# Patient Record
Sex: Female | Born: 1970 | Race: White | Hispanic: No | Marital: Married | Smoking: Former smoker
Health system: Southern US, Community
[De-identification: ages and names within clinical notes are randomized; demographics above are authoritative.]

## PROBLEM LIST (undated history)

## (undated) HISTORY — PX: CHOLECYSTECTOMY: SHX55

---

## 2004-08-13 ENCOUNTER — Ambulatory Visit: Payer: Self-pay | Admitting: Obstetrics and Gynecology

## 2004-09-26 ENCOUNTER — Observation Stay: Payer: Self-pay

## 2004-10-18 ENCOUNTER — Inpatient Hospital Stay: Payer: Self-pay

## 2007-01-14 ENCOUNTER — Ambulatory Visit: Payer: Self-pay

## 2009-11-23 ENCOUNTER — Ambulatory Visit: Payer: Self-pay | Admitting: General Practice

## 2009-12-01 ENCOUNTER — Inpatient Hospital Stay: Payer: Self-pay | Admitting: Internal Medicine

## 2009-12-03 ENCOUNTER — Other Ambulatory Visit: Payer: Self-pay | Admitting: Internal Medicine

## 2009-12-03 ENCOUNTER — Ambulatory Visit: Payer: Self-pay | Admitting: Internal Medicine

## 2010-10-15 ENCOUNTER — Ambulatory Visit: Payer: Self-pay | Admitting: General Practice

## 2011-10-21 ENCOUNTER — Ambulatory Visit: Payer: Self-pay | Admitting: General Practice

## 2012-06-22 ENCOUNTER — Ambulatory Visit: Payer: Self-pay | Admitting: Pain Medicine

## 2012-06-28 ENCOUNTER — Ambulatory Visit: Payer: Self-pay | Admitting: Pain Medicine

## 2012-07-29 ENCOUNTER — Ambulatory Visit: Payer: Self-pay | Admitting: Pain Medicine

## 2012-08-03 ENCOUNTER — Ambulatory Visit: Payer: Self-pay | Admitting: Physician Assistant

## 2012-08-18 ENCOUNTER — Ambulatory Visit: Payer: Self-pay | Admitting: Pain Medicine

## 2013-02-02 ENCOUNTER — Ambulatory Visit: Payer: Self-pay | Admitting: Orthopedic Surgery

## 2013-06-07 ENCOUNTER — Ambulatory Visit: Payer: Self-pay | Admitting: Pain Medicine

## 2013-06-20 ENCOUNTER — Ambulatory Visit: Payer: Self-pay | Admitting: Pain Medicine

## 2013-07-19 ENCOUNTER — Ambulatory Visit: Payer: Self-pay | Admitting: Pain Medicine

## 2013-08-15 ENCOUNTER — Ambulatory Visit: Payer: Self-pay | Admitting: Pain Medicine

## 2013-10-27 ENCOUNTER — Ambulatory Visit: Payer: Self-pay | Admitting: Nurse Practitioner

## 2016-05-20 ENCOUNTER — Ambulatory Visit: Payer: Self-pay | Admitting: Physician Assistant

## 2016-05-20 VITALS — BP 148/89 | HR 106 | Temp 98.0°F

## 2016-05-20 DIAGNOSIS — J069 Acute upper respiratory infection, unspecified: Secondary | ICD-10-CM

## 2016-05-20 LAB — POCT INFLUENZA A/B
INFLUENZA A, POC: NEGATIVE
INFLUENZA B, POC: NEGATIVE

## 2016-05-20 MED ORDER — PREDNISONE 10 MG PO TABS: 30.0000 mg | ORAL_TABLET | Freq: Every day | ORAL | 0 refills | Status: DC

## 2016-05-20 MED ORDER — AZITHROMYCIN 250 MG PO TABS: ORAL_TABLET | ORAL | 0 refills | Status: DC

## 2016-05-20 NOTE — Progress Notes (Signed)
S: C/o runny nose and congestion with dry cough for 4 days, denies  fever, chills,+ body aches;  denies cp/sob, v/d; mucus was green this am but clear throughout the day, cough is sporadic,   Using otc meds: mucinex  O: PE: vitals wnl, nad,  perrl eomi, normocephalic, tms dull, nasal mucosa red and swollen, throat injected, neck supple no lymph, lungs c t a, cv rrr, neuro intact, flu swab neg  A:  Acute uri   P: drink fluids, continue regular meds , use otc meds of choice, return if not improving in 5 days, return earlier if worsening , zpack, prednisone 30 mg qd x 3d

## 2016-07-17 ENCOUNTER — Encounter: Payer: Self-pay | Admitting: Physician Assistant

## 2016-07-17 ENCOUNTER — Ambulatory Visit: Payer: Self-pay | Admitting: Physician Assistant

## 2016-07-17 VITALS — BP 124/68 | HR 110 | Temp 97.8°F | Ht 62.0 in | Wt 142.0 lb

## 2016-07-17 DIAGNOSIS — Z0000 Encounter for general adult medical examination without abnormal findings: Secondary | ICD-10-CM

## 2016-07-17 MED ORDER — GABAPENTIN 300 MG PO CAPS: 300.0000 mg | ORAL_CAPSULE | Freq: Every day | ORAL | 3 refills | Status: DC

## 2016-07-17 NOTE — Progress Notes (Signed)
S: pt here for wellness physical had biometrics for insurance purposes done at work, states she is having hot flashes, taking zoloft for mood swings, perimenopausal, no complaints ros neg. PMH:  migraines  Social: former smoker, no etoh or drugs  Fam: both parents have htn  O: vitals wnl, nad, ENT wnl, neck supple no lymph, lungs c t a, cv rrr, abd soft nontender bs normal all 4 quads  A: wellness physical, perimenopausal, hot flashes  P: neurontin, mm order

## 2016-10-03 ENCOUNTER — Encounter: Payer: Self-pay | Admitting: Radiology

## 2016-10-03 ENCOUNTER — Ambulatory Visit
Admission: RE | Admit: 2016-10-03 | Discharge: 2016-10-03 | Disposition: A | Discharge status: Home / Self Care | Payer: Managed Care, Other (non HMO) | Source: Ambulatory Visit | Attending: Physician Assistant | Admitting: Physician Assistant

## 2016-10-03 DIAGNOSIS — Z1231 Encounter for screening mammogram for malignant neoplasm of breast: Secondary | ICD-10-CM | POA: Insufficient documentation

## 2016-10-03 DIAGNOSIS — Z0000 Encounter for general adult medical examination without abnormal findings: Secondary | ICD-10-CM

## 2016-12-18 DIAGNOSIS — K580 Irritable bowel syndrome with diarrhea: Secondary | ICD-10-CM | POA: Insufficient documentation

## 2016-12-18 DIAGNOSIS — R1084 Generalized abdominal pain: Secondary | ICD-10-CM | POA: Insufficient documentation

## 2016-12-18 HISTORY — DX: Irritable bowel syndrome with diarrhea: K58.0

## 2016-12-24 ENCOUNTER — Other Ambulatory Visit: Payer: Self-pay | Admitting: Gastroenterology

## 2016-12-24 DIAGNOSIS — R1013 Epigastric pain: Secondary | ICD-10-CM

## 2017-01-05 ENCOUNTER — Ambulatory Visit
Admission: RE | Admit: 2017-01-05 | Discharge: 2017-01-05 | Disposition: A | Discharge status: Home / Self Care | Payer: Managed Care, Other (non HMO) | Source: Ambulatory Visit | Attending: Gastroenterology | Admitting: Gastroenterology

## 2017-03-23 ENCOUNTER — Ambulatory Visit
Admission: RE | Admit: 2017-03-23 | Discharge: 2017-03-23 | Disposition: A | Discharge status: Home / Self Care | Payer: Managed Care, Other (non HMO) | Source: Ambulatory Visit | Attending: Gastroenterology | Admitting: Gastroenterology

## 2017-03-23 DIAGNOSIS — R1013 Epigastric pain: Secondary | ICD-10-CM | POA: Diagnosis present

## 2017-03-23 DIAGNOSIS — Z9049 Acquired absence of other specified parts of digestive tract: Secondary | ICD-10-CM | POA: Diagnosis not present

## 2018-01-01 ENCOUNTER — Other Ambulatory Visit: Payer: Self-pay | Admitting: Gastroenterology

## 2018-01-01 DIAGNOSIS — R140 Abdominal distension (gaseous): Secondary | ICD-10-CM

## 2018-01-07 ENCOUNTER — Encounter: Payer: Self-pay | Admitting: Radiology

## 2018-01-07 ENCOUNTER — Ambulatory Visit
Admission: RE | Admit: 2018-01-07 | Discharge: 2018-01-07 | Disposition: A | Discharge status: Home / Self Care | Payer: Managed Care, Other (non HMO) | Source: Ambulatory Visit | Attending: Gastroenterology | Admitting: Gastroenterology

## 2018-01-07 DIAGNOSIS — R140 Abdominal distension (gaseous): Secondary | ICD-10-CM | POA: Diagnosis present

## 2018-01-07 DIAGNOSIS — D259 Leiomyoma of uterus, unspecified: Secondary | ICD-10-CM | POA: Diagnosis not present

## 2018-01-07 MED ORDER — IOPAMIDOL (ISOVUE-300) INJECTION 61%
85.0000 mL | Freq: Once | INTRAVENOUS | Status: AC | PRN
  Administered 2018-01-07: 85 mL via INTRAVENOUS

## 2018-08-23 ENCOUNTER — Encounter: Payer: Self-pay | Admitting: *Deleted

## 2019-05-06 ENCOUNTER — Encounter: Payer: Self-pay | Admitting: Internal Medicine

## 2019-05-20 ENCOUNTER — Other Ambulatory Visit: Payer: Self-pay | Admitting: Nurse Practitioner

## 2019-05-20 DIAGNOSIS — Z1231 Encounter for screening mammogram for malignant neoplasm of breast: Secondary | ICD-10-CM

## 2019-06-02 ENCOUNTER — Ambulatory Visit
Admission: RE | Admit: 2019-06-02 | Discharge: 2019-06-02 | Disposition: A | Discharge status: Home / Self Care | Payer: Managed Care, Other (non HMO) | Source: Ambulatory Visit | Attending: Nurse Practitioner | Admitting: Nurse Practitioner

## 2019-06-02 DIAGNOSIS — Z1231 Encounter for screening mammogram for malignant neoplasm of breast: Secondary | ICD-10-CM | POA: Diagnosis present

## 2019-06-27 ENCOUNTER — Other Ambulatory Visit: Payer: Self-pay

## 2019-06-28 ENCOUNTER — Ambulatory Visit (INDEPENDENT_AMBULATORY_CARE_PROVIDER_SITE_OTHER): Payer: Managed Care, Other (non HMO) | Admitting: Gastroenterology

## 2019-06-28 ENCOUNTER — Other Ambulatory Visit: Payer: Self-pay

## 2019-06-28 ENCOUNTER — Encounter: Payer: Self-pay | Admitting: Gastroenterology

## 2019-06-28 VITALS — BP 134/87 | HR 98 | Temp 98.1°F | Ht 63.0 in | Wt 145.2 lb

## 2019-06-28 DIAGNOSIS — R140 Abdominal distension (gaseous): Secondary | ICD-10-CM

## 2019-06-28 DIAGNOSIS — K580 Irritable bowel syndrome with diarrhea: Secondary | ICD-10-CM

## 2019-06-28 DIAGNOSIS — R1013 Epigastric pain: Secondary | ICD-10-CM | POA: Diagnosis not present

## 2019-06-28 DIAGNOSIS — K30 Functional dyspepsia: Secondary | ICD-10-CM | POA: Diagnosis not present

## 2019-06-28 HISTORY — DX: Functional dyspepsia: K30

## 2019-06-28 MED ORDER — AMITRIPTYLINE HCL 25 MG PO TABS: 25.0000 mg | ORAL_TABLET | Freq: Every day | ORAL | 0 refills | Status: DC

## 2019-06-28 MED ORDER — RIFAXIMIN 550 MG PO TABS: 550.0000 mg | ORAL_TABLET | Freq: Two times a day (BID) | ORAL | 0 refills | Status: AC

## 2019-06-28 NOTE — Progress Notes (Signed)
Cephas Darby, MD [address]  [place]  [place], [place] [address]  Main: [phone]  Fax: [phone]    Gastroenterology Consultation  Referring Provider:     Danelle Berry, NP Primary Care Physician:  Danelle Berry, NP Primary Gastroenterologist: [facility] Reason for Consultation:   Upper abdominal pain, IBS diarrhea        HPI:   Anna Barnett is a 49 y.o. female referred by Dr. Danelle Berry, NP  for consultation & management of upper abdominal pain, IBS diarrhea.  Patient reports that she has long history GI symptoms.  She had history of cholecystectomy in 1994 secondary to severe epigastric pain which has subsided for 2-3 years since removal of gallbladder.  After this, she had recurrence of severe epigastric, squeezing type of pain associated with nausea and bilious emesis.  She has been taking dicyclomine for several years, 20 mg which provides relief, sometimes she had to take several pills in order to alleviate the pain.  She also had ER visits due to these attacks.  In addition to these intermittent attacks of epigastric pain, she has been also experiencing abdominal bloating, associated with postprandial urgency, diarrhea.  She definitely notices stress triggers these symptoms.  She had history of diarrhea, later resulted in constipation.  Over the last few years, her diarrhea has returned.  Patient also acknowledges that she was drinking several cans of soda daily which she has significantly cut down to about twice a week only about 2 cans/week.  Patient underwent several upper endoscopies, colonoscopy including biopsies which were all unremarkable.  Unclear if she was ever tested for celiac disease.  Her labs including CBC, CMP, hemoglobin A1c, TSH were normal in 2019. She was last seen by [facility] in 04/2018.  She decided to move to [place] for second opinion as well as to establish care  Patient is  currently on Protonix, she also tried several different medications including Linzess, hyoscyamine in the past  NSAIDs: None  Antiplts/Anticoagulants/Anti thrombotics: None  GI Procedures: Upper endoscopy in 01/1993 Colonoscopy in 07/08/2002  Upper endoscopy and colonoscopy 03/09/2017, unremarkable Patient denies family history of GI malignancy, celiac disease, inflammatory bowel disease  History reviewed. No pertinent past medical history.  History reviewed. No pertinent surgical history.   Current Outpatient Medications:  .  baclofen (LIORESAL) 10 MG tablet, Take 10 mg by mouth at bedtime as needed., Disp: , Rfl:  .  butalbital-acetaminophen-caffeine (FIORICET, ESGIC) 50-325-40 MG tablet, 2 TABS EVERY MORNING THEN 2 TABS EVERY EVENING AS NEEDED FOR HEADACHES NO MORE THAN 4 A DAY, Disp: , Rfl: 3 .  dicyclomine (BENTYL) 20 MG tablet, Take by mouth., Disp: , Rfl:  .  hydrOXYzine (ATARAX/VISTARIL) 10 MG tablet, Take 10 mg by mouth 2 (two) times daily as needed., Disp: , Rfl:  .  ketorolac (TORADOL) 10 MG tablet, Take 10 mg by mouth every morning., Disp: , Rfl:  .  ondansetron (ZOFRAN) 4 MG tablet, TAKE 1 TABLET BY MOUTH EVERY 8 HOURS AS NEEDED FOR NAUSEA, Disp: , Rfl:  .  pantoprazole (PROTONIX) 40 MG tablet, Take by mouth., Disp: , Rfl:  .  rosuvastatin (CRESTOR) 5 MG tablet, Take 5 mg by mouth at bedtime., Disp: , Rfl:  .  amitriptyline (ELAVIL) 25 MG tablet, Take 1 tablet (25 mg total) by mouth at bedtime., Disp: 30 tablet, Rfl: 0 .  rifaximin (XIFAXAN) 550 MG TABS tablet, Take 1 tablet (550 mg total) by  mouth 2 (two) times daily for 14 days., Disp: 28 tablet, Rfl: 0  Family History  Problem Relation Age of Onset  . Breast cancer Neg Hx      Social History   Tobacco Use  . Smoking status: Former Research scientist (life sciences)  . Smokeless tobacco: Never Used  Substance Use Topics  . Alcohol use: No  . Drug use: No    Allergies as of 06/28/2019  . (No Known Allergies)    Review of Systems:      All systems reviewed and negative except where noted in HPI.   Physical Exam:  BP 134/87 (BP Location: Left Arm, Patient Position: Sitting, Cuff Size: Normal)   Pulse 98   Temp 98.1 F (36.7 C) (Oral)   Ht 5\' 3"  (1.6 m)   Wt 145 lb 4 oz (65.9 kg)   LMP 12/12/2015 (Within Weeks) Comment: Pt states she has been in menopause since 2017.   BMI 25.73 kg/m  Patient's last menstrual period was 12/12/2015 (within weeks).  General:   Alert,  Well-developed, well-nourished, pleasant and cooperative in NAD Head:  Normocephalic and atraumatic. Eyes:  Sclera clear, no icterus.   Conjunctiva pink. Ears:  Normal auditory acuity. Nose:  No deformity, discharge, or lesions. Mouth:  No deformity or lesions,oropharynx pink & moist. Neck:  Supple; no masses or thyromegaly. Lungs:  Respirations even and unlabored.  Clear throughout to auscultation.   No wheezes, crackles, or rhonchi. No acute distress. Heart:  Regular rate and rhythm; no murmurs, clicks, rubs, or gallops. Abdomen:  Normal bowel sounds. Soft, non-tender and non-distended without masses, hepatosplenomegaly or hernias noted.  No guarding or rebound tenderness.   Rectal: Not performed Msk:  Symmetrical without gross deformities. Good, equal movement & strength bilaterally. Pulses:  Normal pulses noted. Extremities:  No clubbing or edema.  No cyanosis. Neurologic:  Alert and oriented x3;  grossly normal neurologically. Skin:  Intact without significant lesions or rashes. No jaundice. Psych:  Alert and cooperative. Normal mood and affect.  Imaging Studies: Reviewed  Assessment and Plan:   Anna Barnett is a 49 y.o. female with s/p cholecystectomy 1994, is seen in consultation for second opinion and establish care for chronic intermittent attacks of epigastric pain associated with nausea and bilious emesis, abdominal bloating, postprandial urgency and nonbloody diarrhea with no other constitutional symptoms.  Her symptoms are  triggered by stress.  Bidirectional endoscopy including biopsies are unremarkable in the past  Dyspepsia: Functional No evidence of H. pylori infection in the past Trial of amitriptyline 25 mg at bedtime Continue Protonix 40 mg daily Trial of FD guard  IBS-diarrhea: Perform celiac disease panel Continue Bentyl 20 mg up to 4 times daily as needed Trial of rifaximin 550 mg twice daily for 2 weeks Trial of amitriptyline 25 mg at bedtime and increase to 50 mg in 2 weeks if able to tolerate Given her samples of IBgard   Follow up in 2 months   Cephas Darby, MD

## 2019-06-30 LAB — CELIAC DISEASE PANEL
Endomysial IgA: NEGATIVE
IgA/Immunoglobulin A, Serum: 66 mg/dL — ABNORMAL LOW (ref 87–352)
Transglutaminase IgA: <2 U/mL (ref 0–3)

## 2019-06-30 LAB — TISSUE TRANSGLUTAMINASE, IGG: Tissue Transglut Ab: <2 U/mL (ref 0–5)

## 2019-07-04 ENCOUNTER — Other Ambulatory Visit: Payer: Self-pay | Admitting: Gastroenterology

## 2019-07-04 MED ORDER — ONDANSETRON HCL 4 MG PO TABS: 4.0000 mg | ORAL_TABLET | Freq: Three times a day (TID) | ORAL | 1 refills | Status: DC | PRN

## 2019-07-04 MED ORDER — DICYCLOMINE HCL 20 MG PO TABS: 20.0000 mg | ORAL_TABLET | Freq: Three times a day (TID) | ORAL | 0 refills | Status: AC

## 2019-07-04 NOTE — Telephone Encounter (Signed)
dicyclomine (BENTYL) 20 MG tablet ondansetron (ZOFRAN) 4 MG tablet  [facility]

## 2019-07-04 NOTE — Telephone Encounter (Signed)
Last office visit 06/28/2019 abdominal bloating  Last refill Bentyl 12/03/2018 historical medication  zofran Historical medication

## 2019-07-21 ENCOUNTER — Other Ambulatory Visit: Payer: Self-pay | Admitting: Gastroenterology

## 2019-08-19 ENCOUNTER — Other Ambulatory Visit: Payer: Self-pay | Admitting: Gastroenterology

## 2019-08-22 ENCOUNTER — Encounter: Payer: Self-pay | Admitting: Gastroenterology

## 2019-08-22 ENCOUNTER — Telehealth (INDEPENDENT_AMBULATORY_CARE_PROVIDER_SITE_OTHER): Payer: Managed Care, Other (non HMO) | Admitting: Gastroenterology

## 2019-08-22 ENCOUNTER — Telehealth: Payer: Self-pay

## 2019-08-22 DIAGNOSIS — R1013 Epigastric pain: Secondary | ICD-10-CM

## 2019-08-22 DIAGNOSIS — K580 Irritable bowel syndrome with diarrhea: Secondary | ICD-10-CM

## 2019-08-22 MED ORDER — ONDANSETRON HCL 4 MG PO TABS: 4.0000 mg | ORAL_TABLET | Freq: Three times a day (TID) | ORAL | 1 refills | Status: DC | PRN

## 2019-08-22 NOTE — Telephone Encounter (Signed)
Has appointment today

## 2019-08-22 NOTE — Telephone Encounter (Signed)
Per Dr. Marius Ditch Schedule upper endoscopy for dyspepsia, chronic nausea, duodenal biopsies  Called and left a message for call back to scheduled appointment. For EGD.  Sent mychart message

## 2019-08-22 NOTE — Progress Notes (Signed)
Sherri Sear, MD [address]  [place]  [place], [place] [address]  Main: [phone]  Fax: [phone]    Gastroenterology Consultation Video Visit  Referring Provider:     Danelle Berry, NP Primary Care Physician:  Danelle Berry, NP Primary Gastroenterologist:  Dr. Cephas Darby Reason for Consultation:     Diarrhea predominant IBS and dyspepsia        HPI:   Anna Barnett is a 49 y.o. female referred by Dr. Danelle Berry, NP  for consultation & management of IBS-diarrhea and dyspepsia  Virtual Visit Video Note  I connected with Anna Barnett on 08/22/19 at  1:15 PM EDT by video and verified that I am speaking with the correct person using two identifiers.   I discussed the limitations, risks, security and privacy concerns of performing an evaluation and management service by video and the availability of in person appointments. I also discussed with the patient that there may be a patient responsible charge related to this service. The patient expressed understanding and agreed to proceed.  Location of the Patient: Home  Location of the provider: Office  Persons participating in the visit: Patient and provider only   History of Present Illness:  Patient is seen for follow-up of IBS.  She reports that she did not take amitriptyline because this was the medication she tried in the past which did not help.  She reports upper abdominal pain associated with abdominal bloating as well as nausea.  Celiac serologies revealed IgA deficiency.  Patient did not try rifaximin that I suggested during last visit.  She filled in the prescription.  Her weight has been stable.   NSAIDs: None  Antiplts/Anticoagulants/Anti thrombotics: None  GI Procedures: Upper endoscopy in 01/1993 Colonoscopy in 07/08/2002  Upper endoscopy and colonoscopy 03/09/2017, unremarkable Patient denies family history of GI malignancy, celiac disease, inflammatory bowel  disease  History reviewed. No pertinent past medical history.  History reviewed. No pertinent surgical history.  Current Outpatient Medications:  .  baclofen (LIORESAL) 10 MG tablet, Take 10 mg by mouth at bedtime as needed., Disp: , Rfl:  .  butalbital-acetaminophen-caffeine (FIORICET, ESGIC) 50-325-40 MG tablet, 2 TABS EVERY MORNING THEN 2 TABS EVERY EVENING AS NEEDED FOR HEADACHES NO MORE THAN 4 A DAY, Disp: , Rfl: 3 .  dicyclomine (BENTYL) 20 MG tablet, Take 1 tablet (20 mg total) by mouth 3 (three) times daily before meals., Disp: 90 tablet, Rfl: 0 .  hydrOXYzine (ATARAX/VISTARIL) 10 MG tablet, Take 10 mg by mouth 2 (two) times daily as needed., Disp: , Rfl:  .  ketorolac (TORADOL) 10 MG tablet, Take 10 mg by mouth every morning., Disp: , Rfl:  .  ondansetron (ZOFRAN) 4 MG tablet, Take 1 tablet (4 mg total) by mouth every 8 (eight) hours as needed. for nausea, Disp: 30 tablet, Rfl: 1 .  pantoprazole (PROTONIX) 40 MG tablet, Take by mouth., Disp: , Rfl:  .  rosuvastatin (CRESTOR) 5 MG tablet, Take 5 mg by mouth at bedtime., Disp: , Rfl:  .  amitriptyline (ELAVIL) 25 MG tablet, TAKE 1 TABLET BY MOUTH EVERYDAY AT BEDTIME (Patient not taking: Reported on 08/22/2019), Disp: 90 tablet, Rfl: 0 .  ondansetron (ZOFRAN) 4 MG tablet, TAKE 1 TABLET BY MOUTH EVERY 8 HOURS AS NEEDED FOR NAUSEA, Disp: 20 tablet, Rfl: 1   Family History  Problem Relation Age of Onset  . Breast cancer Neg Hx      Social History  Tobacco Use  . Smoking status: Former Research scientist (life sciences)  . Smokeless tobacco: Never Used  Substance Use Topics  . Alcohol use: No  . Drug use: No    Allergies as of 08/22/2019  . (No Known Allergies)     Imaging Studies: Reviewed  Assessment and Plan:   Anna Barnett is a 49 y.o. female with diarrhea predominant IBS, abdominal bloating and discomfort  Diarrhea predominant IBS Trial of rifaximin 550mg  3 times a day for 2 weeks Discussed about low FODMAP diet if antibiotics do not  help EGD with duodenal biopsies to rule out celiac disease as patient has IgA deficiency  Dyspepsia EGD unremarkable, no evidence of H. pylori Continue Protonix 40 mg daily Zofran as needed for nausea   Follow Up Instructions:   I discussed the assessment and treatment plan with the patient. The patient was provided an opportunity to ask questions and all were answered. The patient agreed with the plan and demonstrated an understanding of the instructions.   The patient was advised to call back or seek an in-person evaluation if the symptoms worsen or if the condition fails to improve as anticipated.  I provided 15 minutes of face-to-face time during this encounter.   Follow up in 81months   Cephas Darby, MD

## 2019-08-23 NOTE — Telephone Encounter (Signed)
Looks like you already sent patient message via [place].  Let us wait  RV

## 2019-08-23 NOTE — Telephone Encounter (Signed)
Called and left a message for call back  

## 2019-08-23 NOTE — Telephone Encounter (Signed)
Called and left a message for call back. Sent patient a Therapist, music. Please advised what I should do since unable to reach patient

## 2019-10-10 ENCOUNTER — Other Ambulatory Visit: Payer: Self-pay | Admitting: Gastroenterology

## 2019-10-10 NOTE — Telephone Encounter (Signed)
Last office visit 08/22/2019 Dyspepsia  Last refill 08/22/2019 1 refill  Has appointment 11/23/2019

## 2019-11-16 ENCOUNTER — Other Ambulatory Visit: Payer: Self-pay | Admitting: Gastroenterology

## 2019-11-16 NOTE — Telephone Encounter (Signed)
Last office visit 08/22/2019 dyspepsia  Last refill 10/10/2019 1 refills

## 2019-11-23 ENCOUNTER — Ambulatory Visit: Payer: Managed Care, Other (non HMO) | Admitting: Gastroenterology

## 2019-12-20 ENCOUNTER — Other Ambulatory Visit: Payer: Self-pay | Admitting: Gastroenterology

## 2019-12-20 NOTE — Telephone Encounter (Signed)
Last office visit 08/22/2019 Dyspepsia Last refill 11/17/2019 1 refills

## 2020-01-11 LAB — HM PAP SMEAR

## 2020-01-11 LAB — RESULTS CONSOLE HPV: CHL HPV: NEGATIVE

## 2020-01-30 ENCOUNTER — Other Ambulatory Visit: Payer: Self-pay | Admitting: Gastroenterology

## 2020-01-30 NOTE — Telephone Encounter (Signed)
Last office visit 08/22/2019 Dyspepsia  Last refill 12/20/2019 1 refills  Canceled appointment on 11/23/2019

## 2020-04-01 ENCOUNTER — Other Ambulatory Visit: Payer: Self-pay | Admitting: Gastroenterology

## 2020-05-09 ENCOUNTER — Other Ambulatory Visit: Payer: Self-pay | Admitting: Gastroenterology

## 2020-05-09 NOTE — Telephone Encounter (Signed)
Last office visit 08/22/2019 dyspepsia  Last refill 04/02/2020 1 refills  No appointment is scheduled

## 2020-07-24 ENCOUNTER — Other Ambulatory Visit: Payer: Self-pay | Admitting: Gastroenterology

## 2020-08-09 ENCOUNTER — Other Ambulatory Visit: Payer: Self-pay | Admitting: Physician Assistant

## 2020-08-09 ENCOUNTER — Ambulatory Visit
Admission: RE | Admit: 2020-08-09 | Discharge: 2020-08-09 | Disposition: A | Discharge status: Home / Self Care | Payer: Managed Care, Other (non HMO) | Source: Ambulatory Visit | Attending: Physician Assistant | Admitting: Physician Assistant

## 2020-08-09 DIAGNOSIS — M542 Cervicalgia: Secondary | ICD-10-CM

## 2020-08-09 DIAGNOSIS — M549 Dorsalgia, unspecified: Secondary | ICD-10-CM

## 2020-08-17 ENCOUNTER — Other Ambulatory Visit: Payer: Self-pay | Admitting: Gastroenterology

## 2020-08-17 NOTE — Telephone Encounter (Signed)
Patient has been not seen in our office since 08/22/2019. Patient has been seeing [facility] GI

## 2020-09-18 ENCOUNTER — Other Ambulatory Visit: Payer: Self-pay | Admitting: Gastroenterology

## 2020-10-22 ENCOUNTER — Other Ambulatory Visit: Payer: Self-pay | Admitting: Gastroenterology

## 2020-10-29 ENCOUNTER — Other Ambulatory Visit: Payer: Self-pay | Admitting: Gastroenterology

## 2020-10-30 ENCOUNTER — Other Ambulatory Visit: Payer: Self-pay | Admitting: Gastroenterology

## 2021-05-28 ENCOUNTER — Other Ambulatory Visit: Payer: Self-pay | Admitting: Nurse Practitioner

## 2021-05-28 DIAGNOSIS — Z1231 Encounter for screening mammogram for malignant neoplasm of breast: Secondary | ICD-10-CM

## 2021-07-09 ENCOUNTER — Ambulatory Visit
Admission: RE | Admit: 2021-07-09 | Discharge: 2021-07-09 | Disposition: A | Discharge status: Home / Self Care | Payer: Managed Care, Other (non HMO) | Source: Ambulatory Visit | Attending: Nurse Practitioner | Admitting: Nurse Practitioner

## 2021-07-09 DIAGNOSIS — Z1231 Encounter for screening mammogram for malignant neoplasm of breast: Secondary | ICD-10-CM | POA: Insufficient documentation

## 2022-04-07 ENCOUNTER — Other Ambulatory Visit: Payer: Self-pay | Admitting: Nurse Practitioner

## 2022-04-07 DIAGNOSIS — M62838 Other muscle spasm: Secondary | ICD-10-CM

## 2022-04-07 MED ORDER — CYCLOBENZAPRINE HCL 10 MG PO TABS: 10.0000 mg | ORAL_TABLET | Freq: Three times a day (TID) | ORAL | 0 refills | Status: DC | PRN

## 2022-04-18 ENCOUNTER — Other Ambulatory Visit: Payer: Self-pay | Admitting: Nurse Practitioner

## 2022-04-18 DIAGNOSIS — R519 Headache, unspecified: Secondary | ICD-10-CM

## 2022-05-02 ENCOUNTER — Other Ambulatory Visit: Payer: Self-pay | Admitting: Nurse Practitioner

## 2022-05-02 DIAGNOSIS — M62838 Other muscle spasm: Secondary | ICD-10-CM

## 2022-05-07 ENCOUNTER — Other Ambulatory Visit: Payer: Self-pay | Admitting: Nurse Practitioner

## 2022-05-07 DIAGNOSIS — R519 Headache, unspecified: Secondary | ICD-10-CM

## 2022-05-11 ENCOUNTER — Other Ambulatory Visit: Payer: Self-pay | Admitting: Nurse Practitioner

## 2022-05-11 DIAGNOSIS — M62838 Other muscle spasm: Secondary | ICD-10-CM

## 2022-05-24 ENCOUNTER — Other Ambulatory Visit: Payer: Self-pay | Admitting: Nurse Practitioner

## 2022-05-24 DIAGNOSIS — M62838 Other muscle spasm: Secondary | ICD-10-CM

## 2022-05-26 ENCOUNTER — Other Ambulatory Visit: Payer: Self-pay | Admitting: Nurse Practitioner

## 2022-05-26 DIAGNOSIS — R519 Headache, unspecified: Secondary | ICD-10-CM

## 2022-06-03 ENCOUNTER — Other Ambulatory Visit: Payer: Self-pay | Admitting: Nurse Practitioner

## 2022-06-14 ENCOUNTER — Other Ambulatory Visit: Payer: Self-pay | Admitting: Nurse Practitioner

## 2022-06-14 DIAGNOSIS — M62838 Other muscle spasm: Secondary | ICD-10-CM

## 2022-06-19 ENCOUNTER — Other Ambulatory Visit: Payer: Self-pay | Admitting: Nurse Practitioner

## 2022-06-19 DIAGNOSIS — R519 Headache, unspecified: Secondary | ICD-10-CM

## 2022-07-07 ENCOUNTER — Other Ambulatory Visit: Payer: Self-pay | Admitting: Nurse Practitioner

## 2022-07-07 DIAGNOSIS — M62838 Other muscle spasm: Secondary | ICD-10-CM

## 2022-07-23 ENCOUNTER — Other Ambulatory Visit: Payer: Self-pay | Admitting: Nurse Practitioner

## 2022-07-23 DIAGNOSIS — R519 Headache, unspecified: Secondary | ICD-10-CM

## 2022-07-30 ENCOUNTER — Other Ambulatory Visit: Payer: Self-pay | Admitting: Nurse Practitioner

## 2022-07-30 DIAGNOSIS — M62838 Other muscle spasm: Secondary | ICD-10-CM

## 2022-08-14 ENCOUNTER — Other Ambulatory Visit: Payer: Self-pay | Admitting: Nurse Practitioner

## 2022-08-14 DIAGNOSIS — M62838 Other muscle spasm: Secondary | ICD-10-CM

## 2022-08-18 ENCOUNTER — Other Ambulatory Visit: Payer: Self-pay | Admitting: Nurse Practitioner

## 2022-08-18 DIAGNOSIS — R519 Headache, unspecified: Secondary | ICD-10-CM

## 2022-08-28 ENCOUNTER — Other Ambulatory Visit: Payer: Self-pay | Admitting: Family

## 2022-08-28 DIAGNOSIS — M62838 Other muscle spasm: Secondary | ICD-10-CM

## 2022-08-29 ENCOUNTER — Other Ambulatory Visit: Payer: Self-pay | Admitting: Family

## 2022-08-29 DIAGNOSIS — M62838 Other muscle spasm: Secondary | ICD-10-CM

## 2022-08-29 MED ORDER — CYCLOBENZAPRINE HCL 10 MG PO TABS: ORAL_TABLET | ORAL | 1 refills | Status: DC

## 2022-09-11 ENCOUNTER — Other Ambulatory Visit: Payer: Self-pay | Admitting: Nurse Practitioner

## 2022-09-11 DIAGNOSIS — R519 Headache, unspecified: Secondary | ICD-10-CM

## 2022-09-18 ENCOUNTER — Other Ambulatory Visit: Payer: Self-pay | Admitting: Nurse Practitioner

## 2022-09-18 DIAGNOSIS — Z1231 Encounter for screening mammogram for malignant neoplasm of breast: Secondary | ICD-10-CM

## 2022-09-24 ENCOUNTER — Ambulatory Visit
Admission: RE | Admit: 2022-09-24 | Discharge: 2022-09-24 | Disposition: A | Discharge status: Home / Self Care | Payer: Managed Care, Other (non HMO) | Source: Ambulatory Visit | Attending: Nurse Practitioner | Admitting: Nurse Practitioner

## 2022-09-24 DIAGNOSIS — Z1231 Encounter for screening mammogram for malignant neoplasm of breast: Secondary | ICD-10-CM | POA: Insufficient documentation

## 2022-09-29 NOTE — Progress Notes (Signed)
Patient informed via my chart.

## 2022-10-11 ENCOUNTER — Other Ambulatory Visit: Payer: Self-pay | Admitting: Family

## 2022-10-11 DIAGNOSIS — R519 Headache, unspecified: Secondary | ICD-10-CM

## 2022-10-13 ENCOUNTER — Other Ambulatory Visit: Payer: Self-pay | Admitting: Nurse Practitioner

## 2022-10-13 DIAGNOSIS — R519 Headache, unspecified: Secondary | ICD-10-CM

## 2022-10-16 MED ORDER — BUTALBITAL-APAP-CAFFEINE 50-300-40 MG PO CAPS: ORAL_CAPSULE | ORAL | 0 refills | Status: DC

## 2022-10-27 ENCOUNTER — Other Ambulatory Visit: Payer: Self-pay | Admitting: Family

## 2022-10-27 DIAGNOSIS — M62838 Other muscle spasm: Secondary | ICD-10-CM

## 2022-11-10 ENCOUNTER — Encounter: Payer: Self-pay | Admitting: Family

## 2022-11-10 ENCOUNTER — Ambulatory Visit: Payer: Managed Care, Other (non HMO) | Admitting: Family

## 2022-11-10 VITALS — BP 143/71 | HR 74 | Ht 63.0 in | Wt 144.4 lb

## 2022-11-10 DIAGNOSIS — K580 Irritable bowel syndrome with diarrhea: Secondary | ICD-10-CM

## 2022-11-10 DIAGNOSIS — M62838 Other muscle spasm: Secondary | ICD-10-CM

## 2022-11-10 DIAGNOSIS — G43E09 Chronic migraine with aura, not intractable, without status migrainosus: Secondary | ICD-10-CM | POA: Diagnosis not present

## 2022-11-10 HISTORY — DX: Other muscle spasm: M62.838

## 2022-11-10 HISTORY — DX: Chronic migraine with aura, not intractable, without status migrainosus: G43.E09

## 2022-11-10 MED ORDER — BUTALBITAL-APAP-CAFFEINE 50-325-40 MG PO TABS: ORAL_TABLET | ORAL | 3 refills | Status: DC

## 2022-11-10 MED ORDER — CYCLOBENZAPRINE HCL 10 MG PO TABS: ORAL_TABLET | ORAL | 1 refills | Status: DC

## 2022-11-10 NOTE — Assessment & Plan Note (Signed)
Patient stable.  Well controlled with current therapy.   Continue current meds.  

## 2022-11-10 NOTE — Progress Notes (Signed)
Established Patient Office Visit  Subjective:  Patient ID: Anna Barnett, female    DOB: 05/11/1970  Age: 52 y.o. MRN: [id number]  Chief Complaint  Patient presents with   Follow-up    Patient is here today for her follow up.  She has been feeling fairly well since last appointment.   She does not have additional concerns to discuss today.  Labs are not due today. She needs refills.   I have reviewed her active problem list, medication list, allergies, notes from last encounter, lab results for her appointment today.    No other concerns at this time.   Past Medical History:  Diagnosis Date   Chronic migraine with aura without status migrainosus, not intractable 11/10/2022   Irritable bowel syndrome with diarrhea 12/18/2016   Muscle spasms of neck 11/10/2022   Non-ulcer dyspepsia 06/28/2019    No past surgical history on file.  Social History   Socioeconomic History   Marital status: Married    Spouse name: Not on file   Number of children: Not on file   Years of education: Not on file   Highest education level: Not on file  Occupational History   Not on file  Tobacco Use   Smoking status: Former   Smokeless tobacco: Never  Substance and Sexual Activity   Alcohol use: No   Drug use: No   Sexual activity: Not on file  Other Topics Concern   Not on file  Social History Narrative   Not on file   Social Determinants of Health   Financial Resource Strain: Not on file  Food Insecurity: Not on file  Transportation Needs: Not on file  Physical Activity: Not on file  Stress: Not on file  Social Connections: Not on file  Intimate Partner Violence: Not on file    Family History  Problem Relation Age of Onset   Breast cancer Neg Hx     No Known Allergies  Review of Systems  All other systems reviewed and are negative.      Objective:   BP (!) 143/71   Pulse 74   Ht 5\' 3"  (1.6 m)   Wt 144 lb 6.4 oz (65.5 kg)   LMP 12/12/2015 (Within Weeks)  Comment: Pt states she has been in menopause since 2017.   SpO2 98%   BMI 25.58 kg/m   Vitals:   11/10/22 0929  BP: (!) 143/71  Pulse: 74  Height: 5\' 3"  (1.6 m)  Weight: 144 lb 6.4 oz (65.5 kg)  SpO2: 98%  BMI (Calculated): 25.59    Physical Exam Vitals and nursing note reviewed.  Constitutional:      Appearance: Normal appearance. She is normal weight.  HENT:     Head: Normocephalic.  Eyes:     Extraocular Movements: Extraocular movements intact.     Conjunctiva/sclera: Conjunctivae normal.     Pupils: Pupils are equal, round, and reactive to light.  Cardiovascular:     Rate and Rhythm: Normal rate.  Pulmonary:     Effort: Pulmonary effort is normal.  Musculoskeletal:        General: Normal range of motion.  Neurological:     General: No focal deficit present.     Mental Status: She is alert and oriented to person, place, and time. Mental status is at baseline.  Psychiatric:        Mood and Affect: Mood normal.        Behavior: Behavior normal.  Thought Content: Thought content normal.        Judgment: Judgment normal.      No results found for any visits on 11/10/22.  No results found for this or any previous visit (from the past 2160 hour(s)).     Assessment & Plan:   Problem List Items Addressed This Visit       Active Problems   Irritable bowel syndrome with diarrhea    Patient stable.  Well controlled with current therapy.   Continue current meds.        Chronic migraine with aura without status migrainosus, not intractable - Primary    Patient stable.  Well controlled with current therapy.   Continue current meds.       Relevant Medications   cyclobenzaprine (FLEXERIL) 10 MG tablet   butalbital-acetaminophen-caffeine (FIORICET) 50-325-40 MG tablet   Muscle spasms of neck    Patient stable.  Well controlled with current therapy.   Continue current meds.       Relevant Medications   cyclobenzaprine (FLEXERIL) 10 MG tablet     Return in about 6 months (around 05/10/2023) for F/U.   Total time spent: 20 minutes  Miki Kins, FNP  11/10/2022   This document may have been prepared by [facility] Voice Recognition software and as such may include unintentional dictation errors.

## 2022-12-11 ENCOUNTER — Telehealth: Payer: Self-pay | Admitting: Family

## 2022-12-11 NOTE — Telephone Encounter (Signed)
Patient left VM that she is having major family issues - she is wanting something for her nerves. She is wanting something mild. Please advise.  I did call patient to let her know Marchelle Folks is out of office today and will return tomorrow.

## 2022-12-16 ENCOUNTER — Other Ambulatory Visit: Payer: Self-pay

## 2022-12-16 ENCOUNTER — Other Ambulatory Visit: Payer: Self-pay | Admitting: Family

## 2022-12-16 MED ORDER — BUSPIRONE HCL 5 MG PO TABS: 5.0000 mg | ORAL_TABLET | Freq: Two times a day (BID) | ORAL | 0 refills | Status: DC

## 2022-12-19 ENCOUNTER — Other Ambulatory Visit: Payer: Self-pay | Admitting: Family

## 2023-01-06 ENCOUNTER — Other Ambulatory Visit: Payer: Self-pay | Admitting: Family

## 2023-01-06 DIAGNOSIS — M62838 Other muscle spasm: Secondary | ICD-10-CM

## 2023-02-11 ENCOUNTER — Other Ambulatory Visit: Payer: Self-pay | Admitting: Family

## 2023-03-03 ENCOUNTER — Other Ambulatory Visit: Payer: Self-pay | Admitting: Family

## 2023-03-03 DIAGNOSIS — M62838 Other muscle spasm: Secondary | ICD-10-CM

## 2023-05-03 ENCOUNTER — Other Ambulatory Visit: Payer: Self-pay | Admitting: Family

## 2023-05-03 DIAGNOSIS — M62838 Other muscle spasm: Secondary | ICD-10-CM

## 2023-05-07 ENCOUNTER — Other Ambulatory Visit

## 2023-05-07 DIAGNOSIS — I10 Essential (primary) hypertension: Secondary | ICD-10-CM

## 2023-05-07 DIAGNOSIS — E669 Obesity, unspecified: Secondary | ICD-10-CM

## 2023-05-07 DIAGNOSIS — E785 Hyperlipidemia, unspecified: Secondary | ICD-10-CM

## 2023-05-08 LAB — HEMOGLOBIN A1C
Est. average glucose Bld gHb Est-mCnc: 111 mg/dL
Hgb A1c MFr Bld: 5.5 % (ref 4.8–5.6)

## 2023-05-08 LAB — LIPID PANEL
Chol/HDL Ratio: 2.1 ratio (ref 0.0–4.4)
Cholesterol, Total: 226 mg/dL — ABNORMAL HIGH (ref 100–199)
HDL: 106 mg/dL (ref >39)
LDL Chol Calc (NIH): 103 mg/dL — ABNORMAL HIGH (ref 0–99)
Triglycerides: 99 mg/dL (ref 0–149)
VLDL Cholesterol Cal: 17 mg/dL (ref 5–40)

## 2023-05-08 LAB — CMP14+EGFR
ALT: 41 IU/L — ABNORMAL HIGH (ref 0–32)
AST: 20 IU/L (ref 0–40)
Albumin: 4.7 g/dL (ref 3.8–4.9)
Alkaline Phosphatase: 161 IU/L — ABNORMAL HIGH (ref 44–121)
BUN/Creatinine Ratio: 16 (ref 9–23)
BUN: 13 mg/dL (ref 6–24)
Bilirubin Total: <0.2 mg/dL (ref 0.0–1.2)
CO2: 21 mmol/L (ref 20–29)
Calcium: 9.5 mg/dL (ref 8.7–10.2)
Chloride: 105 mmol/L (ref 96–106)
Creatinine, Ser: 0.81 mg/dL (ref 0.57–1.00)
Globulin, Total: 1.8 g/dL (ref 1.5–4.5)
Glucose: 92 mg/dL (ref 70–99)
Potassium: 4.3 mmol/L (ref 3.5–5.2)
Sodium: 141 mmol/L (ref 134–144)
Total Protein: 6.5 g/dL (ref 6.0–8.5)
eGFR: 87 mL/min/{1.73_m2} (ref >59)

## 2023-05-08 LAB — TSH: TSH: 1.48 u[IU]/mL (ref 0.450–4.500)

## 2023-05-11 ENCOUNTER — Ambulatory Visit: Payer: Managed Care, Other (non HMO) | Admitting: Family

## 2023-05-11 ENCOUNTER — Encounter: Payer: Self-pay | Admitting: Family

## 2023-05-11 VITALS — BP 120/84 | HR 85 | Ht 63.0 in | Wt 162.4 lb

## 2023-05-11 DIAGNOSIS — E559 Vitamin D deficiency, unspecified: Secondary | ICD-10-CM

## 2023-05-11 DIAGNOSIS — E782 Mixed hyperlipidemia: Secondary | ICD-10-CM | POA: Diagnosis not present

## 2023-05-11 DIAGNOSIS — Z0130 Encounter for examination of blood pressure without abnormal findings: Secondary | ICD-10-CM

## 2023-05-11 DIAGNOSIS — E663 Overweight: Secondary | ICD-10-CM

## 2023-05-11 DIAGNOSIS — G43E09 Chronic migraine with aura, not intractable, without status migrainosus: Secondary | ICD-10-CM | POA: Diagnosis not present

## 2023-05-11 MED ORDER — BUPROPION HCL ER (XL) 150 MG PO TB24: 150.0000 mg | ORAL_TABLET | ORAL | 2 refills | Status: DC

## 2023-05-11 NOTE — Assessment & Plan Note (Signed)
 Patient stable.  Well controlled with current therapy.   Continue current meds.

## 2023-05-11 NOTE — Progress Notes (Signed)
 Established Patient Office Visit  Subjective:  Patient ID: Anna Barnett, female    DOB: 03-Jan-1971  Age: 53 y.o. MRN: [id number]  Chief Complaint  Patient presents with   Follow-up    6 month follow up    Patient is here today for her 6 months follow up.  She has been feeling fairly well since last appointment.   She does have additional concerns to discuss today.  She started making changes to help with weight loss, but has not been losing as she would expect to.   Labs were done previously, so we will review these in detail today.  She needs refills.   I have reviewed her active problem list, medication list, allergies, notes from last encounter, lab results for her appointment today.      No other concerns at this time.   Past Medical History:  Diagnosis Date   Chronic migraine with aura without status migrainosus, not intractable 11/10/2022   Irritable bowel syndrome with diarrhea 12/18/2016   Muscle spasms of neck 11/10/2022   Non-ulcer dyspepsia 06/28/2019    History reviewed. No pertinent surgical history.  Social History   Socioeconomic History   Marital status: Married    Spouse name: Not on file   Number of children: Not on file   Years of education: Not on file   Highest education level: Not on file  Occupational History   Not on file  Tobacco Use   Smoking status: Former   Smokeless tobacco: Never  Substance and Sexual Activity   Alcohol use: No   Drug use: No   Sexual activity: Not on file  Other Topics Concern   Not on file  Social History Narrative   Not on file   Social Drivers of Health   Financial Resource Strain: Not on file  Food Insecurity: Not on file  Transportation Needs: Not on file  Physical Activity: Not on file  Stress: Not on file  Social Connections: Not on file  Intimate Partner Violence: Not on file    Family History  Problem Relation Age of Onset   Breast cancer Neg Hx     No Known Allergies  Review  of Systems  All other systems reviewed and are negative.     Objective:   BP 120/84   Pulse 85   Ht 5\' 3"  (1.6 m)   Wt 162 lb 6.4 oz (73.7 kg)   LMP 12/12/2015 (Within Weeks) Comment: Pt states she has been in menopause since 2017.   SpO2 99%   BMI 28.77 kg/m   Vitals:   05/11/23 0935  BP: 120/84  Pulse: 85  Height: 5\' 3"  (1.6 m)  Weight: 162 lb 6.4 oz (73.7 kg)  SpO2: 99%  BMI (Calculated): 28.78    Physical Exam Vitals and nursing note reviewed.  Constitutional:      Appearance: Normal appearance. She is normal weight.  HENT:     Head: Normocephalic.  Eyes:     Extraocular Movements: Extraocular movements intact.     Conjunctiva/sclera: Conjunctivae normal.     Pupils: Pupils are equal, round, and reactive to light.  Cardiovascular:     Rate and Rhythm: Normal rate.  Pulmonary:     Effort: Pulmonary effort is normal.  Neurological:     General: No focal deficit present.     Mental Status: She is alert and oriented to person, place, and time. Mental status is at baseline.  Psychiatric:  Mood and Affect: Mood normal.        Behavior: Behavior normal.        Thought Content: Thought content normal.        Judgment: Judgment normal.      No results found for any visits on 05/11/23.  Recent Results (from the past 2160 hours)  Hemoglobin A1c     Status: None   Collection Time: 05/07/23  9:13 AM  Result Value Ref Range   Hgb A1c MFr Bld 5.5 4.8 - 5.6 %    Comment:          Prediabetes: 5.7 - 6.4          Diabetes: >6.4          Glycemic control for adults with diabetes: <7.0    Est. average glucose Bld gHb Est-mCnc 111 mg/dL  TSH     Status: None   Collection Time: 05/07/23  9:13 AM  Result Value Ref Range   TSH 1.480 0.450 - 4.500 uIU/mL  CMP14+EGFR     Status: Abnormal   Collection Time: 05/07/23  9:13 AM  Result Value Ref Range   Glucose 92 70 - 99 mg/dL   BUN 13 6 - 24 mg/dL   Creatinine, Ser 2.95 0.57 - 1.00 mg/dL   eGFR 87 >62  ZH/YQM/5.78   BUN/Creatinine Ratio 16 9 - 23   Sodium 141 134 - 144 mmol/L   Potassium 4.3 3.5 - 5.2 mmol/L   Chloride 105 96 - 106 mmol/L   CO2 21 20 - 29 mmol/L   Calcium 9.5 8.7 - 10.2 mg/dL   Total Protein 6.5 6.0 - 8.5 g/dL   Albumin 4.7 3.8 - 4.9 g/dL   Globulin, Total 1.8 1.5 - 4.5 g/dL   Bilirubin Total <4.6 0.0 - 1.2 mg/dL   Alkaline Phosphatase 161 (H) 44 - 121 IU/L   AST 20 0 - 40 IU/L   ALT 41 (H) 0 - 32 IU/L  Lipid panel     Status: Abnormal   Collection Time: 05/07/23  9:13 AM  Result Value Ref Range   Cholesterol, Total 226 (H) 100 - 199 mg/dL   Triglycerides 99 0 - 149 mg/dL   HDL 962 >95 mg/dL   VLDL Cholesterol Cal 17 5 - 40 mg/dL   LDL Chol Calc (NIH) 284 (H) 0 - 99 mg/dL   Chol/HDL Ratio 2.1 0.0 - 4.4 ratio    Comment:                                   T. Chol/HDL Ratio                                             Men  Women                               1/2 Avg.Risk  3.4    3.3                                   Avg.Risk  5.0    4.4  2X Avg.Risk  9.6    7.1                                3X Avg.Risk 23.4   11.0        Assessment & Plan:   Problem List Items Addressed This Visit       Cardiovascular and Mediastinum   Chronic migraine with aura without status migrainosus, not intractable - Primary   Patient stable.  Well controlled with current therapy.   Continue current meds.       Relevant Medications   buPROPion (WELLBUTRIN XL) 150 MG 24 hr tablet   Other Visit Diagnoses       Mixed hyperlipidemia       Patient LDL slightly elevated, but HDL is excellent and LDL/HDL ratio is well below average risk as well.  Will continue to monitor her levels.     Vitamin D deficiency, unspecified       Checking labs today.  Will continue supplements as needed.     Overweight       Advised watching macros instead of calories.  Sending RX for bupropion as well to help as appetite suppressant.   Will reassess at follow up.         Return in about 1 month (around 06/11/2023) for F/U.   Total time spent: 20 minutes  Miki Kins, FNP  05/11/2023   This document may have been prepared by [facility] Voice Recognition software and as such may include unintentional dictation errors.

## 2023-05-12 LAB — T4: T4, Total: 7.3 ug/dL (ref 4.5–12.0)

## 2023-05-12 LAB — T3: T3, Total: 112 ng/dL (ref 71–180)

## 2023-05-12 LAB — SPECIMEN STATUS REPORT

## 2023-06-02 ENCOUNTER — Other Ambulatory Visit: Payer: Self-pay | Admitting: Family

## 2023-06-11 ENCOUNTER — Encounter: Payer: Self-pay | Admitting: Family

## 2023-06-11 ENCOUNTER — Ambulatory Visit: Admitting: Family

## 2023-06-11 ENCOUNTER — Other Ambulatory Visit: Payer: Self-pay | Admitting: Family

## 2023-06-11 VITALS — BP 122/68 | HR 91 | Ht 63.0 in | Wt 157.6 lb

## 2023-06-11 DIAGNOSIS — Z0130 Encounter for examination of blood pressure without abnormal findings: Secondary | ICD-10-CM

## 2023-06-11 DIAGNOSIS — E663 Overweight: Secondary | ICD-10-CM | POA: Diagnosis not present

## 2023-06-11 MED ORDER — BUPROPION HCL ER (XL) 300 MG PO TB24: 300.0000 mg | ORAL_TABLET | Freq: Every day | ORAL | 1 refills | Status: DC

## 2023-06-11 NOTE — Progress Notes (Signed)
 Established Patient Office Visit  Subjective:  Patient ID: Anna Barnett, female    DOB: Apr 11, 1970  Age: 53 y.o. MRN: [id number]  Chief Complaint  Patient presents with   Follow-up    1 month follow up    Patient is here today for her 1 month follow up.  She has been feeling fairly well since last appointment.   She does have additional concerns to discuss today.  She says that she has been noticing a difference from the bupropion, asks if we could increase the dose.   Labs are not due today. She needs refills.   I have reviewed her active problem list, medication list, allergies, notes from last encounter, lab results for her appointment today.      No other concerns at this time.   Past Medical History:  Diagnosis Date   Chronic migraine with aura without status migrainosus, not intractable 11/10/2022   Irritable bowel syndrome with diarrhea 12/18/2016   Muscle spasms of neck 11/10/2022   Non-ulcer dyspepsia 06/28/2019    No past surgical history on file.  Social History   Socioeconomic History   Marital status: Married    Spouse name: Not on file   Number of children: Not on file   Years of education: Not on file   Highest education level: Not on file  Occupational History   Not on file  Tobacco Use   Smoking status: Former   Smokeless tobacco: Never  Substance and Sexual Activity   Alcohol use: No   Drug use: No   Sexual activity: Not on file  Other Topics Concern   Not on file  Social History Narrative   Not on file   Social Drivers of Health   Financial Resource Strain: Not on file  Food Insecurity: Not on file  Transportation Needs: Not on file  Physical Activity: Not on file  Stress: Not on file  Social Connections: Not on file  Intimate Partner Violence: Not on file    Family History  Problem Relation Age of Onset   Breast cancer Neg Hx     No Known Allergies  Review of Systems  All other systems reviewed and are  negative.      Objective:   BP 122/68   Pulse 91   Ht 5\' 3"  (1.6 m)   Wt 157 lb 9.6 oz (71.5 kg)   LMP 12/12/2015 (Within Weeks) Comment: Pt states she has been in menopause since 2017.   SpO2 99%   BMI 27.92 kg/m   Vitals:   06/11/23 1021  BP: 122/68  Pulse: 91  Height: 5\' 3"  (1.6 m)  Weight: 157 lb 9.6 oz (71.5 kg)  SpO2: 99%  BMI (Calculated): 27.92    Physical Exam Vitals and nursing note reviewed.  Constitutional:      Appearance: Normal appearance. She is well-developed and overweight.  HENT:     Head: Normocephalic and atraumatic.  Eyes:     Extraocular Movements: Extraocular movements intact.     Conjunctiva/sclera: Conjunctivae normal.     Pupils: Pupils are equal, round, and reactive to light.  Cardiovascular:     Rate and Rhythm: Normal rate.  Pulmonary:     Effort: Pulmonary effort is normal.  Neurological:     General: No focal deficit present.     Mental Status: She is alert and oriented to person, place, and time. Mental status is at baseline.  Psychiatric:        Mood and Affect:  Mood normal.        Behavior: Behavior normal.        Thought Content: Thought content normal.        Judgment: Judgment normal.      No results found for any visits on 06/11/23.  Recent Results (from the past 2160 hours)  Hemoglobin A1c     Status: None   Collection Time: 05/07/23  9:13 AM  Result Value Ref Range   Hgb A1c MFr Bld 5.5 4.8 - 5.6 %    Comment:          Prediabetes: 5.7 - 6.4          Diabetes: >6.4          Glycemic control for adults with diabetes: <7.0    Est. average glucose Bld gHb Est-mCnc 111 mg/dL  TSH     Status: None   Collection Time: 05/07/23  9:13 AM  Result Value Ref Range   TSH 1.480 0.450 - 4.500 uIU/mL  CMP14+EGFR     Status: Abnormal   Collection Time: 05/07/23  9:13 AM  Result Value Ref Range   Glucose 92 70 - 99 mg/dL   BUN 13 6 - 24 mg/dL   Creatinine, Ser 1.61 0.57 - 1.00 mg/dL   eGFR 87 >09 UE/AVW/0.98    BUN/Creatinine Ratio 16 9 - 23   Sodium 141 134 - 144 mmol/L   Potassium 4.3 3.5 - 5.2 mmol/L   Chloride 105 96 - 106 mmol/L   CO2 21 20 - 29 mmol/L   Calcium 9.5 8.7 - 10.2 mg/dL   Total Protein 6.5 6.0 - 8.5 g/dL   Albumin 4.7 3.8 - 4.9 g/dL   Globulin, Total 1.8 1.5 - 4.5 g/dL   Bilirubin Total <1.1 0.0 - 1.2 mg/dL   Alkaline Phosphatase 161 (H) 44 - 121 IU/L   AST 20 0 - 40 IU/L   ALT 41 (H) 0 - 32 IU/L  Lipid panel     Status: Abnormal   Collection Time: 05/07/23  9:13 AM  Result Value Ref Range   Cholesterol, Total 226 (H) 100 - 199 mg/dL   Triglycerides 99 0 - 149 mg/dL   HDL 914 >78 mg/dL   VLDL Cholesterol Cal 17 5 - 40 mg/dL   LDL Chol Calc (NIH) 295 (H) 0 - 99 mg/dL   Chol/HDL Ratio 2.1 0.0 - 4.4 ratio    Comment:                                   T. Chol/HDL Ratio                                             Men  Women                               1/2 Avg.Risk  3.4    3.3                                   Avg.Risk  5.0    4.4  2X Avg.Risk  9.6    7.1                                3X Avg.Risk 23.4   11.0   T4     Status: None   Collection Time: 05/07/23  9:13 AM  Result Value Ref Range   T4, Total 7.3 4.5 - 12.0 ug/dL  T3     Status: None   Collection Time: 05/07/23  9:13 AM  Result Value Ref Range   T3, Total 112 71 - 180 ng/dL  Specimen status report     Status: None   Collection Time: 05/07/23  9:13 AM  Result Value Ref Range   specimen status report Comment     Comment: Written Authorization Written Authorization Written Authorization Received. Authorization received from Evander [place]  for [employer] on 05-11-2023 Logged by Criselda Dolly        Assessment & Plan:   Problem List Items Addressed This Visit       Other   Overweight (BMI 25.0-29.9) - Primary   Increasing bupropion to 300mg  daily.  Will reassess at follow up appointment.        Return in about 5 months (around 11/11/2023) for F/U.   Total  time spent: 20 minutes  Trenda Frisk, FNP  06/11/2023   This document may have been prepared by [facility] Voice Recognition software and as such may include unintentional dictation errors.

## 2023-06-14 DIAGNOSIS — E663 Overweight: Secondary | ICD-10-CM | POA: Insufficient documentation

## 2023-06-14 NOTE — Assessment & Plan Note (Signed)
 Increasing bupropion to 300mg  daily.  Will reassess at follow up appointment.

## 2023-06-24 ENCOUNTER — Other Ambulatory Visit: Payer: Self-pay | Admitting: Gastroenterology

## 2023-06-24 ENCOUNTER — Encounter: Payer: Self-pay | Admitting: Gastroenterology

## 2023-06-24 DIAGNOSIS — R748 Abnormal levels of other serum enzymes: Secondary | ICD-10-CM

## 2023-06-24 DIAGNOSIS — R1084 Generalized abdominal pain: Secondary | ICD-10-CM

## 2023-06-30 ENCOUNTER — Ambulatory Visit
Admission: RE | Admit: 2023-06-30 | Discharge: 2023-06-30 | Disposition: A | Discharge status: Home / Self Care | Source: Ambulatory Visit | Attending: Gastroenterology | Admitting: Gastroenterology

## 2023-06-30 ENCOUNTER — Other Ambulatory Visit: Payer: Self-pay | Admitting: Family

## 2023-06-30 DIAGNOSIS — M62838 Other muscle spasm: Secondary | ICD-10-CM

## 2023-06-30 DIAGNOSIS — R748 Abnormal levels of other serum enzymes: Secondary | ICD-10-CM

## 2023-06-30 DIAGNOSIS — R1084 Generalized abdominal pain: Secondary | ICD-10-CM

## 2023-06-30 MED ORDER — IOPAMIDOL (ISOVUE-300) INJECTION 61%
100.0000 mL | Freq: Once | INTRAVENOUS | Status: AC | PRN
  Administered 2023-06-30: 100 mL via INTRAVENOUS

## 2023-07-15 ENCOUNTER — Other Ambulatory Visit: Payer: Self-pay | Admitting: Family

## 2023-07-15 DIAGNOSIS — Z1231 Encounter for screening mammogram for malignant neoplasm of breast: Secondary | ICD-10-CM

## 2023-08-09 ENCOUNTER — Other Ambulatory Visit: Payer: Self-pay | Admitting: Family

## 2023-08-24 ENCOUNTER — Other Ambulatory Visit: Payer: Self-pay | Admitting: Gastroenterology

## 2023-08-24 DIAGNOSIS — R1013 Epigastric pain: Secondary | ICD-10-CM

## 2023-08-24 DIAGNOSIS — R748 Abnormal levels of other serum enzymes: Secondary | ICD-10-CM

## 2023-08-29 ENCOUNTER — Other Ambulatory Visit: Payer: Self-pay | Admitting: Family

## 2023-08-29 DIAGNOSIS — M62838 Other muscle spasm: Secondary | ICD-10-CM

## 2023-09-14 ENCOUNTER — Encounter

## 2023-09-28 ENCOUNTER — Ambulatory Visit
Admission: RE | Admit: 2023-09-28 | Discharge: 2023-09-28 | Disposition: A | Discharge status: Home / Self Care | Source: Ambulatory Visit | Attending: Family | Admitting: Family

## 2023-09-28 DIAGNOSIS — Z1231 Encounter for screening mammogram for malignant neoplasm of breast: Secondary | ICD-10-CM | POA: Insufficient documentation

## 2023-09-29 ENCOUNTER — Other Ambulatory Visit: Payer: Self-pay | Admitting: Gastroenterology

## 2023-09-29 DIAGNOSIS — R748 Abnormal levels of other serum enzymes: Secondary | ICD-10-CM

## 2023-09-29 DIAGNOSIS — R1013 Epigastric pain: Secondary | ICD-10-CM

## 2023-10-05 ENCOUNTER — Other Ambulatory Visit: Payer: Self-pay | Admitting: Family

## 2023-10-06 ENCOUNTER — Ambulatory Visit
Admission: RE | Admit: 2023-10-06 | Discharge: 2023-10-06 | Disposition: A | Discharge status: Home / Self Care | Source: Ambulatory Visit | Attending: Gastroenterology | Admitting: Gastroenterology

## 2023-10-06 DIAGNOSIS — R748 Abnormal levels of other serum enzymes: Secondary | ICD-10-CM

## 2023-10-06 DIAGNOSIS — R1013 Epigastric pain: Secondary | ICD-10-CM

## 2023-10-06 MED ORDER — GADOPICLENOL 0.5 MMOL/ML IV SOLN
10.0000 mL | Freq: Once | INTRAVENOUS | Status: AC | PRN
  Administered 2023-10-06: 7 mL via INTRAVENOUS

## 2023-10-07 ENCOUNTER — Encounter: Payer: Self-pay | Admitting: Family

## 2023-10-25 ENCOUNTER — Encounter: Payer: Self-pay | Admitting: Emergency Medicine

## 2023-10-25 ENCOUNTER — Ambulatory Visit
Admission: EM | Admit: 2023-10-25 | Discharge: 2023-10-25 | Disposition: A | Discharge status: Home / Self Care | Attending: Family Medicine | Admitting: Family Medicine

## 2023-10-25 DIAGNOSIS — H6121 Impacted cerumen, right ear: Secondary | ICD-10-CM | POA: Diagnosis not present

## 2023-10-25 NOTE — ED Triage Notes (Signed)
 Patient c/o right ear pain and ringing that started a week ago.  Patient reports sinus congestion and pressure for 2 weeks. Patient denies fevers.

## 2023-10-25 NOTE — ED Provider Notes (Signed)
 [facility]    CSN: [id number] Arrival date & time: 10/25/23  9050      History   Chief Complaint Chief Complaint  Patient presents with   Otalgia    right    HPI Anna Barnett is a 53 y.o. female.   HPI   [name] presents for right  ear pain  with some left sided ear discomfort that started on Friday.  Has fullness, tinnitus, popping and decreased hearing in the right. Has had intermittent tinnitus for past 2 months.  She has an appointment with ENT in mid-September.  Has known allergies and sinus trouble.  Has sinus headache, itchy eyes and itchy nose. Denies fever, sore throat, cough, vomiting, diarrhea. Tried alternating Benadryl and Claritin.   Occasionally takes Excedrin migraine. No new medications.       Past Medical History:  Diagnosis Date   Chronic migraine with aura without status migrainosus, not intractable 11/10/2022   Irritable bowel syndrome with diarrhea 12/18/2016   Muscle spasms of neck 11/10/2022   Non-ulcer dyspepsia 06/28/2019    Patient Active Problem List   Diagnosis Date Noted   Overweight (BMI 25.0-29.9) 06/14/2023   Chronic migraine with aura without status migrainosus, not intractable 11/10/2022   Muscle spasms of neck 11/10/2022   Non-ulcer dyspepsia 06/28/2019   Irritable bowel syndrome with diarrhea 12/18/2016    Past Surgical History:  Procedure Laterality Date   CHOLECYSTECTOMY      OB History   No obstetric history on file.      Home Medications    Prior to Admission medications   Medication Sig Start Date End Date Taking? Authorizing Provider  buPROPion  (WELLBUTRIN  XL) 300 MG 24 hr tablet Take 1 tablet (300 mg total) by mouth daily. 06/11/23  Yes Orlean Alan HERO, FNP  butalbital -acetaminophen-caffeine  (FIORICET) 50-325-40 MG tablet THEN 2 TABLETS EVERY EVENING AS NEEDED FOR HEADACHES BUT NO MORE THAN 4 TABLETS A DAY 10/08/23  Yes Orlean Alan HERO, FNP  cyclobenzaprine  (FLEXERIL ) 10 MG tablet TAKE 1  TABLET BY MOUTH THREE TIMES A DAY AS NEEDED FOR MUSCLE SPASM 08/31/23  Yes Orlean Alan HERO, FNP  dicyclomine  (BENTYL ) 20 MG tablet Take 1 tablet (20 mg total) by mouth 3 (three) times daily before meals. 07/04/19 06/11/23  Unk Corinn Skiff, MD  ondansetron  (ZOFRAN ) 4 MG tablet TAKE 1 TABLET BY MOUTH EVERY 8 HOURS AS NEEDED FOR NAUSEA 09/18/20   Vanga, Corinn Skiff, MD    Family History Family History  Problem Relation Age of Onset   Breast cancer Neg Hx     Social History Social History   Tobacco Use   Smoking status: Former   Smokeless tobacco: Never  Vaping Use   Vaping status: Never Used  Substance Use Topics   Alcohol use: No   Drug use: No     Allergies   Patient has no known allergies.   Review of Systems Review of Systems: :negative unless otherwise stated in HPI.      Physical Exam Triage Vital Signs ED Triage Vitals  Encounter Vitals Group     BP 10/25/23 1003 (!) 142/87     Girls Systolic BP Percentile --      Girls Diastolic BP Percentile --      Boys Systolic BP Percentile --      Boys Diastolic BP Percentile --      Pulse Rate 10/25/23 1003 99     Resp 10/25/23 1003 14     Temp 10/25/23 1003 98.2  F (36.8 C)     Temp Source 10/25/23 1003 Oral     SpO2 10/25/23 1003 97 %     Weight 10/25/23 1000 157 lb 10.1 oz (71.5 kg)     Height 10/25/23 1000 5' 3 (1.6 m)     Head Circumference --      Peak Flow --      Pain Score 10/25/23 1000 0     Pain Loc --      Pain Education --      Exclude from Growth Chart --    No data found.  Updated Vital Signs BP (!) 142/87 (BP Location: Left Arm)   Pulse 99   Temp 98.2 F (36.8 C) (Oral)   Resp 14   Ht 5' 3 (1.6 m)   Wt 71.5 kg   LMP 12/12/2015 (Within Weeks) Comment: Pt states she has been in menopause since 2017.   SpO2 97%   BMI 27.92 kg/m   Visual Acuity Right Eye Distance:   Left Eye Distance:   Bilateral Distance:    Right Eye Near:   Left Eye Near:    Bilateral Near:     Physical  Exam GEN:     alert, non-toxic appearing female in no distress    HENT:  mucus membranes moist, no nasal discharge, right TM not visible due to cerumen impaction, left TM normal, normal external auditory canals bilaterally, nontender tragus EYES:   no scleral injection or discharge  NECK:  normal ROM RESP:  no increased work of breathing CVS:   regular rate  Skin:   warm and dry    UC Treatments / Results  Labs (all labs ordered are listed, but only abnormal results are displayed) Labs Reviewed - No data to display  EKG   Radiology No results found.  Procedures Procedures (including critical care time) Procedures Ear Cerumen Removal   Date/Time: @TODAY @ 11:05 AM  Performed by: nursing staff Authorized by: Caprice Porteous, DO   Consent:    Consent obtained:  Verbal   Consent given by:  Patient   Risks discussed:  Bleeding, dizziness, infection, incomplete removal, TM perforation and pain   Alternatives discussed:  No treatment Procedure details:    Location:  Right ear   Procedure type: irrigation   Post-procedure details:    Inspection:  TM intact    Hearing quality:  Improved    Patient tolerance of procedure:  Tolerated well, no immediate complications   Medications Ordered in UC Medications - No data to display  Initial Impression / Assessment and Plan / UC Course  I have reviewed the triage vital signs and the nursing notes.  Pertinent labs & imaging results that were available during my care of the patient were reviewed by me and considered in my medical decision making (see chart for details).      Cerumen impaction  Pt is a 53 y.o. female with 2 days of hearing  loss and right ear pain. Ceruminosis is noted on the right. Wax is removed by syringing debridement.  On reassessment, TM clear and without erythema or bulging. No purulent discharge in canal. Doubt acute otitis media or externa. Hearing has improved. Instructions for home care to prevent wax  buildup are given.  Discussed MDM, treatment plan and plan for follow-up with patient who agrees with plan.   Final Clinical Impressions(s) / UC Diagnoses   Final diagnoses:  Hearing loss of right ear due to cerumen impaction     Discharge  Instructions      Stop by the pharmacy to pick up Debrox or NeilMed clearcanal for earwax removal.         ED Prescriptions   None    PDMP not reviewed this encounter.   Zemira Zehring, DO 10/25/23 1105

## 2023-10-25 NOTE — Discharge Instructions (Signed)
 Stop by the pharmacy to pick up Debrox or NeilMed clearcanal for earwax removal.

## 2023-10-29 ENCOUNTER — Other Ambulatory Visit: Payer: Self-pay | Admitting: Family

## 2023-10-29 DIAGNOSIS — M62838 Other muscle spasm: Secondary | ICD-10-CM

## 2023-11-05 ENCOUNTER — Ambulatory Visit

## 2023-11-05 DIAGNOSIS — D2272 Melanocytic nevi of left lower limb, including hip: Secondary | ICD-10-CM | POA: Diagnosis not present

## 2023-11-05 DIAGNOSIS — L578 Other skin changes due to chronic exposure to nonionizing radiation: Secondary | ICD-10-CM

## 2023-11-05 DIAGNOSIS — W908XXA Exposure to other nonionizing radiation, initial encounter: Secondary | ICD-10-CM

## 2023-11-05 DIAGNOSIS — D229 Melanocytic nevi, unspecified: Secondary | ICD-10-CM

## 2023-11-05 DIAGNOSIS — Z1283 Encounter for screening for malignant neoplasm of skin: Secondary | ICD-10-CM | POA: Diagnosis not present

## 2023-11-05 DIAGNOSIS — L821 Other seborrheic keratosis: Secondary | ICD-10-CM

## 2023-11-05 DIAGNOSIS — L814 Other melanin hyperpigmentation: Secondary | ICD-10-CM

## 2023-11-05 DIAGNOSIS — D1801 Hemangioma of skin and subcutaneous tissue: Secondary | ICD-10-CM

## 2023-11-05 NOTE — Progress Notes (Addendum)
   New Patient Visit   Subjective  Anna Barnett is a 53 y.o. female who presents for the following: Skin Cancer Screening and Full Body Skin Exam. No personal hx of skin cancer or dysplastic nevi. No family Hx of skin cancer.   The patient presents for Total-Body Skin Exam (TBSE) for skin cancer screening and mole check. The patient has spots, moles and lesions to be evaluated, some may be new or changing and the patient may have concern these could be cancer.    The following portions of the chart were reviewed this encounter and updated as appropriate: medications, allergies, medical history  Review of Systems:  No other skin or systemic complaints except as noted in HPI or Assessment and Plan.  Objective  Well appearing patient in no apparent distress; mood and affect are within normal limits.  A full examination was performed including scalp, head, eyes, ears, nose, lips, neck, chest, axillae, abdomen, back, buttocks, bilateral upper extremities, bilateral lower extremities, hands, feet, fingers, toes, fingernails, and toenails. All findings within normal limits unless otherwise noted below.  - Angioma(s): Scattered red vascular papule(s) on the trunk - Lentigo/lentigines: Scattered pigmented macules that are tan to brown in color and are somewhat non-uniform in shape and concentrated in the sun-exposed areas of the trunk and upper extremities - Nevus/nevi: Scattered well-demarcated, regular, pigmented macule(s) and/or papule(s) on the scattered diffusely - Seborrheic Keratosis(es): Stuck-on appearing keratotic papule(s) on the trunk, none irritated with redness, crusting, edema, and/or partial avulsion  left anterior lower extremity with 1 cm x 7 mm brown plaque.   Relevant physical exam findings are noted in the Assessment and Plan.  Exam of nails limited by presence of nail polish.    Assessment & Plan   SKIN CANCER SCREENING PERFORMED TODAY.  ACTINIC DAMAGE - Chronic  condition, secondary to cumulative UV/sun exposure - Recommend daily broad spectrum sunscreen SPF 30+ to sun-exposed areas, reapply every 2 hours as needed.  - Staying in the shade or wearing long sleeves, sun glasses (UVA+UVB protection) and wide brim hats (4-inch brim around the entire circumference of the hat) are also recommended for sun protection.  - Call for new or changing lesions.  LENTIGINES, SEBORRHEIC KERATOSES, HEMANGIOMAS - Benign normal skin lesions - Benign-appearing - Call for any changes  MELANOCYTIC NEVI - Tan-brown and/or pink-flesh-colored symmetric macules and papules - Benign appearing on exam today - Observation - Call clinic for new or changing moles - Recommend daily use of broad spectrum spf 30+ sunscreen to sun-exposed areas.  - Check nails when remove polish.  - left anterior lower extremity with 1 cm x 7 mm brown plaque. Discussed biopsy to rule out dysplasia.  Present as long as patient can remember, without changes. Will continue to monitor - advised to call if changes      Return in about 1 year (around 11/04/2024) for TBSE.  I, Jill Parcell, CMA, am acting as scribe for Lauraine JAYSON Kanaris, MD.   Documentation: I have reviewed the above documentation for accuracy and completeness, and I agree with the above.  Lauraine JAYSON Kanaris, MD

## 2023-11-05 NOTE — Patient Instructions (Addendum)
 Melanoma ABCDEs  Melanoma is the most dangerous type of skin cancer, and is the leading cause of death from skin disease.  You are more likely to develop melanoma if you: Have light-colored skin, light-colored eyes, or red or blond hair Spend a lot of time in the sun Tan regularly, either outdoors or in a tanning bed Have had blistering sunburns, especially during childhood Have a close family member who has had a melanoma Have atypical moles or large birthmarks  Early detection of melanoma is key since treatment is typically straightforward and cure rates are extremely high if we catch it early.   The first sign of melanoma is often a change in a mole or a new dark spot.  The ABCDE system is a way of remembering the signs of melanoma.  A for asymmetry:  The two halves do not match. B for border:  The edges of the growth are irregular. C for color:  A mixture of colors are present instead of an even brown color. D for diameter:  Melanomas are usually (but not always) greater than 6mm - the size of a pencil eraser. E for evolution:  The spot keeps changing in size, shape, and color.  Please check your skin once per month between visits. You can use a small mirror in front and a large mirror behind you to keep an eye on the back side or your body.   If you see any new or changing lesions before your next follow-up, please call to schedule a visit.  Please continue daily skin protection including broad spectrum sunscreen SPF 30+ to sun-exposed areas, reapplying every 2 hours as needed when you're outdoors.   Staying in the shade or wearing long sleeves, sun glasses (UVA+UVB protection) and wide brim hats (4-inch brim around the entire circumference of the hat) are also recommended for sun protection.      Skin Care and Sun Protection  Your skin plays an important role in keeping the entire body healthy. Below are some tips on how to try and maximize skin health from the outside  in.  Bathing  Bathe in mildly warm water every 1 to 2 days, followed by light drying and an application of a thick moisturizer cream or ointment, preferably one that comes in a tub.  Recommended body soaps/washes: - Cerave Hydrating Cleanser Bar - Dove Sensitive Skin Fragrance Free Beauty Bar - Aveeno Active Naturals Skin Relief Body Wash, Fragrance Free - Free & Clear (vanicream) liquid cleanser  Moisturizer  Body moisturizer: Apply a moisturizer throughout the day and after bathing.  When you moisturize after bathing, this locks in the moisture.  This can lead to softer and smoother skin.  Body moisturizers come in ointments, creams, and lotions.  If you have dry skin, we recommend the use of ointments or creams rather than lotions.  In other words, something you scoop out of a jar rather than squirted out.  Ointments and creams are thicker and thus provide better moisturization.    Recommended creams for all over: - Vanicream cream - CeraVe Moisturizing Cream - Eucerin Original Healing Soothing Repair Cream  Recommended ointments: greasy, but do the best job at moisturization - Plain Vaseline (petroleum jelly) - CeraVe Healing ointment - Aquaphor Healing ointment  Face moisturizers: For your face, look for something that is labeled as non-comedogenic (won't clog pores) and oil-free. Your moisturizer for the day should have SPF 30 or higher in it as well, but your moisturizer for night can  be without SPF. Some good examples are: - CeraVe Moisturizing Cream (can be used as a face moisturizer) - La Roche-Posay Toleriane Double Repair Facial Moisturizer with SPF 30 (my favorite for day time) - CeraVe AM (has SPF 30) - CeraVe PM  Sunscreen  Who needs sunscreen? Everyone. Sunscreen use can help prevent skin cancer by protecting you from the sun's harmful ultraviolet rays. Anyone can get skin cancer, regardless of age, gender or race. In fact, it is estimated that one in five Americans  will develop skin cancer in their lifetime.  Sunscreen alone cannot fully protect you. In addition to wearing sunscreen, dermatologists recommend taking the following steps to protect your skin and find skin cancer early:  Seek shade when appropriate, remembering that the sun's rays are strongest between 10 a.m. and 2 p.m. If your shadow is shorter than you are, seek shade. Dress to protect yourself from the sun by wearing a lightweight long-sleeved shirt, pants, a wide-brimmed hat and sunglasses, when possible.  Use extra caution near water, snow and sand as they reflect the damaging rays of the sun, which can increase your chance of sunburn.  Get vitamin D safely through a healthy diet that may include vitamin supplements. Don't seek the sun. Avoid tanning beds. Ultraviolet light from the sun and tanning beds can cause skin cancer and wrinkling. If you want to look tan, you may wish to use a self-tanning product, but continue to use sunscreen with it.  When should I use sunscreen? Every day you go outside--even if you're just walking to and from your form of transportation. The sun emits harmful UV rays year-round. Even on cloudy days, up to 80 percent of the sun's harmful UV rays can penetrate your skin. Snow, sand and water increase the need for sunscreen because they reflect the sun's rays.  How much sunscreen should I use, and how often should I apply it? Most people only apply 25-50 percent of the recommended amount of sunscreen. Apply enough sunscreen to cover all exposed skin. Most adults need about 1 ounce -- or enough to fill a shot glass -- to fully cover their body.  Don't forget to apply to the tops of your feet, your neck, your ears and the top of your head. Apply sunscreen to dry skin 15 minutes before going outdoors.  Skin cancer also can form on the lips. To protect your lips, apply a lip balm or lipstick that contains sunscreen with an SPF of 30 or higher.  When outdoors, reapply  sunscreen approximately every two hours, or after swimming or sweating, according to the directions on the bottle.   Broad-spectrum sunscreens protect against both UVA and UVB rays. What is the difference between the rays? Sunlight consists of two types of harmful rays that reach the earth -- UVA rays and UVB rays. Overexposure to either can lead to skin cancer. In addition to causing skin cancer, here's what each of these rays do:  UVA rays (or aging rays) can prematurely age your skin, causing wrinkles and age spots, and can pass through window glass. UVB rays (or burning rays) are the primary cause of sunburn and are blocked by window glass  There is no safe way to tan. Every time you tan, you damage your skin. As this damage builds, you speed up the aging of your skin and increase your risk for all types of skin cancer.  What is the difference between chemical and physical sunscreens? Chemical sunscreens work like a sponge,  absorbing the sun's rays. They contain one or more of the following active ingredients: oxybenzone, avobenzone, octisalate, octocrylene, homosalate and octinoxate. These formulations tend to be easier to rub into the skin without leaving a white residue.   Physical sunscreens work like a shield, sitting sit on the surface of your skin and deflecting the sun's rays. They contain the active ingredients zinc oxide and/or titanium dioxide. Use this sunscreen if you have sensitive skin.   What type of sunscreen should I use? The best type of sunscreen is the one you will use again and again. Just make sure it offers broad-spectrum (UVA and UVB) protection, has an SPF of 30+, and is water-resistant. The kind of sunscreen you use is a matter of personal choice, and may vary depending on the area of the body to be protected. Available sunscreen options include lotions, creams, gels, ointments, wax sticks and sprays.  Recommended physical sunscreens for face: - Neutrogena Sheer Zinc -  Aveeno Positively Mineral Sensitive - CeraVe Hydrating Mineral (also has a tinted version) - La Roche-Posay Anthelios Mineral Face (comes as a cream, lotion, light fluid, and there is also a tinted version).  - EltaMD UV Clear (also has a tinted version)  Recommended physical sunscreens for body: - Neutrogena Sheer Zinc Dry-Touch Sunscreen Sensitive Skin Lotion Broad Spectrum SPF 50 - Aveeno Positively Mineral Sensitive Skin Sunscreen Broad Spectrum SPF 50 - La Roche-Posay Anthelios SPF 50 Mineral Sunscreen - Gentle Lotion - CeraVe Hydrating Mineral Sunscreen SPF 50  Recommended chemical sunscreens for face: - Anthelios UV Correct Face Sunscreen SPF 70 with Niacinamide - Neutrogena Clear Face Oil-Free SPF 50 with Helioplex - Neutrogena Sport Face Oil-Free SPF 70+ with Helioplex - Aveeno Protect + Hydrate Sunscreen For Face SPF 70 - La Roche-Posay Anthelios Light Fluid Sunscreen for Face SPF 60  Recommended chemical sunscreens for body: - Neutrogena Ultra Sheer Dry-Touch Sunscreen SPF 70 - Aveeno Protect + Hydrate Broad Spectrum All-Day Hydration SPF 60 (comes in a big pump) - La Roche-Posay Anthelios Melt-In Milk Sunscreen SPF 60  Recommended UPF Clothing - Coolibar  - Solbari  - Wallaroo hats  - Materials engineer (On [employer])     Due to recent changes in healthcare laws, you may see results of your pathology and/or laboratory studies on MyChart before the doctors have had a chance to review them. We understand that in some cases there may be results that are confusing or concerning to you. Please understand that not all results are received at the same time and often the doctors may need to interpret multiple results in order to provide you with the best plan of care or course of treatment. Therefore, we ask that you please give us  2 business days to thoroughly review all your results before contacting the office for clarification. Should we see a critical lab result, you will be contacted  sooner.   If You Need Anything After Your Visit  If you have any questions or concerns for your doctor, please call our main line at [phone] and press option 4 to reach your doctor's medical assistant. If no one answers, please leave a voicemail as directed and we will return your call as soon as possible. Messages left after 4 pm will be answered the following business day.   You may also send us  a message via MyChart. We typically respond to MyChart messages within 1-2 business days.  For prescription refills, please ask your pharmacy to contact our office. Our fax number is [phone].  If you have an urgent issue when the clinic is closed that cannot wait until the next business day, you can page your doctor at the number below.    Please note that while we do our best to be available for urgent issues outside of office hours, we are not available 24/7.   If you have an urgent issue and are unable to reach us , you may choose to seek medical care at your doctor's office, retail clinic, urgent care center, or emergency room.  If you have a medical emergency, please immediately call 911 or go to the emergency department.  Pager Numbers  - Dr. Hester: [phone]  - Dr. Jackquline: [phone]  - Dr. Claudene: [phone]   - Dr. Raymund: [phone]  In the event of inclement weather, please call our main line at [phone] for an update on the status of any delays or closures.  Dermatology Medication Tips: Please keep the boxes that topical medications come in in order to help keep track of the instructions about where and how to use these. Pharmacies typically print the medication instructions only on the boxes and not directly on the medication tubes.   If your medication is too expensive, please contact our office at [phone] option 4 or send us  a message through MyChart.   We are unable to tell what your co-pay for medications will be in advance as this is  different depending on your insurance coverage. However, we may be able to find a substitute medication at lower cost or fill out paperwork to get insurance to cover a needed medication.   If a prior authorization is required to get your medication covered by your insurance company, please allow us  1-2 business days to complete this process.  Drug prices often vary depending on where the prescription is filled and some pharmacies may offer cheaper prices.  The website www.goodrx.com contains coupons for medications through different pharmacies. The prices here do not account for what the cost may be with help from insurance (it may be cheaper with your insurance), but the website can give you the price if you did not use any insurance.  - You can print the associated coupon and take it with your prescription to the pharmacy.  - You may also stop by our office during regular business hours and pick up a GoodRx coupon card.  - If you need your prescription sent electronically to a different pharmacy, notify our office through [facility] or by phone at [phone] option 4.     Si Usted Necesita Algo Despus de Su Visita  Tambin puede enviarnos un mensaje a travs de Clinical cytogeneticist. Por lo general respondemos a los mensajes de MyChart en el transcurso de 1 a 2 das hbiles.  Para renovar recetas, por favor pida a su farmacia que se ponga en contacto con nuestra oficina. Randi lakes de fax es [place] [phone].  Si tiene un asunto urgente cuando la clnica est cerrada y que no puede esperar hasta el siguiente da hbil, puede llamar/localizar a su doctor(a) al nmero que aparece a continuacin.   Por favor, tenga en cuenta que aunque hacemos todo lo posible para estar disponibles para asuntos urgentes fuera del horario de [place], no estamos disponibles las 24 horas del da, los 7 [address]  [address] de la [place].   Si tiene un problema urgente y no puede comunicarse con nosotros, puede optar por buscar  atencin mdica  en el consultorio de su doctor(a), en una clnica privada, en un  centro de atencin urgente o en una sala de emergencias.  Si tiene Engineer, drilling, por favor llame inmediatamente al 911 o vaya a la sala de emergencias.  Nmeros de bper  - Dr. Hester: [phone]  - Dra. Jackquline: [phone]  - Dr. Claudene: [phone]  - Dra. Kitts: [phone]  En caso de inclemencias del [place], por favor llame a nuestra lnea principal al [phone] para una actualizacin sobre el estado de cualquier retraso o cierre.  Consejos para la medicacin en dermatologa: Por favor, guarde las cajas en las que vienen los medicamentos de uso tpico para ayudarle a seguir las instrucciones sobre dnde y cmo usarlos. Las farmacias generalmente imprimen las instrucciones del medicamento slo en las cajas y no directamente en los tubos del [place].   Si su medicamento es muy caro, por favor, pngase en contacto con landry rieger llamando al [phone] y presione la opcin 4 o envenos un mensaje a travs de Clinical cytogeneticist.   No podemos decirle cul ser su copago por los medicamentos por adelantado ya que esto es diferente dependiendo de la cobertura de su seguro. Sin embargo, es posible que podamos encontrar un medicamento sustituto a Audiological scientist un formulario para que el seguro cubra el medicamento que se considera necesario.   Si se requiere una autorizacin previa para que su compaa de seguros malta su medicamento, por favor permtanos de 1 a 2 das hbiles para completar este proceso.  Los precios de los medicamentos varan con frecuencia dependiendo del Environmental consultant de dnde se surte la receta y alguna farmacias pueden ofrecer precios ms baratos.  El sitio web www.goodrx.com tiene cupones para medicamentos de Health and safety inspector. Los precios aqu no tienen en cuenta lo que podra costar con la ayuda del seguro (puede ser ms barato con su seguro), pero el sitio web puede  darle el precio si no utiliz Tourist information centre manager.  - Puede imprimir el cupn correspondiente y llevarlo con su receta a la farmacia.  - Tambin puede pasar por nuestra oficina durante el horario de atencin regular y Education officer, museum una tarjeta de cupones de GoodRx.  - Si necesita que su receta se enve electrnicamente a una farmacia diferente, informe a nuestra oficina a travs de MyChart de [place] o por telfono llamando al [phone] y presione la opcin 4.

## 2023-11-11 ENCOUNTER — Ambulatory Visit: Admitting: Family

## 2023-11-11 ENCOUNTER — Encounter: Payer: Self-pay | Admitting: Family

## 2023-11-11 VITALS — BP 126/82 | HR 100 | Ht 63.0 in | Wt 143.0 lb

## 2023-11-11 DIAGNOSIS — E782 Mixed hyperlipidemia: Secondary | ICD-10-CM

## 2023-11-11 DIAGNOSIS — E559 Vitamin D deficiency, unspecified: Secondary | ICD-10-CM | POA: Diagnosis not present

## 2023-11-11 DIAGNOSIS — R5383 Other fatigue: Secondary | ICD-10-CM | POA: Diagnosis not present

## 2023-11-11 DIAGNOSIS — Z1159 Encounter for screening for other viral diseases: Secondary | ICD-10-CM

## 2023-11-11 DIAGNOSIS — E538 Deficiency of other specified B group vitamins: Secondary | ICD-10-CM

## 2023-11-11 DIAGNOSIS — R3589 Other polyuria: Secondary | ICD-10-CM

## 2023-11-11 DIAGNOSIS — E663 Overweight: Secondary | ICD-10-CM

## 2023-11-11 DIAGNOSIS — Z114 Encounter for screening for human immunodeficiency virus [HIV]: Secondary | ICD-10-CM

## 2023-11-11 DIAGNOSIS — Z0130 Encounter for examination of blood pressure without abnormal findings: Secondary | ICD-10-CM

## 2023-11-11 DIAGNOSIS — N951 Menopausal and female climacteric states: Secondary | ICD-10-CM

## 2023-11-11 LAB — POCT URINALYSIS DIPSTICK
Bilirubin, UA: NEGATIVE
Blood, UA: NEGATIVE
Glucose, UA: NEGATIVE
Ketones, UA: NEGATIVE
Leukocytes, UA: NEGATIVE
Nitrite, UA: NEGATIVE
Protein, UA: NEGATIVE
Spec Grav, UA: 1.01 (ref 1.010–1.025)
Urobilinogen, UA: 0.2 U/dL
pH, UA: 6 (ref 5.0–8.0)

## 2023-11-11 MED ORDER — ESTROGENS CONJUGATED 0.625 MG/GM VA CREA: 1.0000 | TOPICAL_CREAM | Freq: Every day | VAGINAL | 12 refills | Status: AC

## 2023-11-11 NOTE — Progress Notes (Unsigned)
 Established Patient Office Visit  Subjective   Patient ID: Anna Barnett, female    DOB: 1970-04-20  Age: 53 y.o. MRN: [id number]  Chief Complaint  Patient presents with   Follow-up    5 month follow up  Patient is here today to FU since starting Wellbutrin  300 mg. She doesn't think that it is working as well as it did initially. Patient has lost approximately 20 pounds since she was here in March. She endorses feeling more tired, increased urination and hunger over the last 2 weeks. Denies fever, chills, burning or pain with urination.   Patient is also experiencing menopause symptoms and endorses vaginal dryness, itching, and discomfort with sexual activity. Last pap smear was 3 years ago due in 2 years, last mammogram was 09/2023. Due for colonoscopy this year and patient states she will schedule one.  Patient states she is feeling tired and having shortness of breath with physical activity.   She is due for labs today and will check urine to rule out UTI.  HPI  {History (Optional):23778}  Review of Systems  Constitutional:  Positive for malaise/fatigue.  HENT: Negative.    Eyes: Negative.   Respiratory:  Positive for shortness of breath.   Cardiovascular: Negative.   Gastrointestinal: Negative.   Genitourinary: Negative.   Musculoskeletal: Negative.   Neurological: Negative.   Endo/Heme/Allergies: Negative.   Psychiatric/Behavioral: Negative.        Objective:     BP 126/82   Pulse 100   Ht 5' 3 (1.6 m)   Wt 143 lb (64.9 kg)   LMP 12/12/2015 (Within Weeks) Comment: Pt states she has been in menopause since 2017.   SpO2 99%   BMI 25.33 kg/m  {Vitals History (Optional):23777}  Physical Exam Vitals reviewed.  Constitutional:      Appearance: Normal appearance.  HENT:     Head: Normocephalic.     Right Ear: External ear normal.     Left Ear: External ear normal.     Nose: Nose normal.     Mouth/Throat:     Mouth: Mucous membranes are moist.  Eyes:      Extraocular Movements: Extraocular movements intact.     Pupils: Pupils are equal, round, and reactive to light.  Cardiovascular:     Rate and Rhythm: Normal rate and regular rhythm.     Pulses: Normal pulses.     Heart sounds: Normal heart sounds.  Pulmonary:     Effort: Pulmonary effort is normal.     Breath sounds: Normal breath sounds.  Abdominal:     General: Abdomen is flat.     Palpations: Abdomen is soft.     Tenderness: There is no right CVA tenderness or left CVA tenderness.  Musculoskeletal:     Cervical back: Normal range of motion and neck supple.  Skin:    General: Skin is warm and dry.     Capillary Refill: Capillary refill takes 2 to 3 seconds.  Neurological:     Mental Status: She is alert.  Psychiatric:        Mood and Affect: Mood normal.        Behavior: Behavior normal.        Thought Content: Thought content normal.      Results for orders placed or performed in visit on 11/11/23  HM PAP SMEAR  Result Value Ref Range   HM Pap smear NIL   POCT Urinalysis Dipstick  Result Value Ref Range   Color, UA Yellow  Clarity, UA Clear    Glucose, UA Negative Negative   Bilirubin, UA Negative    Ketones, UA Negative    Spec Grav, UA 1.010 1.010 - 1.025   Blood, UA Negative    pH, UA 6.0 5.0 - 8.0   Protein, UA Negative Negative   Urobilinogen, UA 0.2 0.2 or 1.0 E.U./dL   Nitrite, UA Negative    Leukocytes, UA Negative Negative   Appearance Clear    Odor No   Results Console HPV  Result Value Ref Range   CHL HPV Negative     {Labs (Optional):23779}  The ASCVD Risk score (Arnett DK, et al., 2019) failed to calculate for the following reasons:   The valid HDL cholesterol range is 20 to 100 mg/dL    Assessment & Plan:  Will collect urinalysis to rule of UTI which came back normal. Will collect routine labs today as well as screening HIV and Hep C lab work. Continue medications as prescribed. Will start Estrogen cream for menopausal vaginal dryness.  Continue making healthy lifestyle changes including diet mostly of lean meat, fruit, vegetables and water intake while exercising at least 150 minutes per week as recommended.  Diagnoses and all orders for this visit: Overweight (BMI 25.0-29.9) -     CBC with Diff -     CMP14+EGFR -     Hemoglobin A1c -     TSH+T4F+T3Free Mixed hyperlipidemia -     Lipid Panel w/o Chol/HDL Ratio Fatigue, unspecified type -     TSH+T4F+T3Free -     Iron, TIBC and Ferritin Panel Vitamin D deficiency, unspecified Polyuria -     POCT Urinalysis Dipstick Menopausal vaginal dryness Need for hepatitis C screening test -     Hepatitis C Ab reflex to Quant PCR Screening for HIV without presence of risk factors -     HIV antibody (with reflex) B12 deficiency due to diet -     Vitamin B12 Other orders -     conjugated estrogens  (PREMARIN ) vaginal cream; Place 1 Applicatorful vaginally daily. For 21 days, then take 7 days off.    No follow-ups on file.    Anna DELENA Cain, FNP

## 2023-11-12 ENCOUNTER — Ambulatory Visit: Payer: Self-pay

## 2023-11-12 LAB — IRON,TIBC AND FERRITIN PANEL
Ferritin: 62 ng/mL (ref 15–150)
Iron Saturation: 26 % (ref 15–55)
Iron: 71 ug/dL (ref 27–159)
Total Iron Binding Capacity: 268 ug/dL (ref 250–450)
UIBC: 197 ug/dL (ref 131–425)

## 2023-11-12 LAB — LIPID PANEL W/O CHOL/HDL RATIO
Cholesterol, Total: 218 mg/dL — ABNORMAL HIGH (ref 100–199)
HDL: 95 mg/dL (ref >39)
LDL Chol Calc (NIH): 105 mg/dL — ABNORMAL HIGH (ref 0–99)
Triglycerides: 106 mg/dL (ref 0–149)
VLDL Cholesterol Cal: 18 mg/dL (ref 5–40)

## 2023-11-12 LAB — CBC WITH DIFFERENTIAL/PLATELET
Basophils Absolute: 0 x10E3/uL (ref 0.0–0.2)
Basos: 1 %
EOS (ABSOLUTE): 0.2 x10E3/uL (ref 0.0–0.4)
Eos: 2 %
Hematocrit: 37.3 % (ref 34.0–46.6)
Hemoglobin: 12.2 g/dL (ref 11.1–15.9)
Immature Grans (Abs): 0 x10E3/uL (ref 0.0–0.1)
Immature Granulocytes: 0 %
Lymphocytes Absolute: 2.4 x10E3/uL (ref 0.7–3.1)
Lymphs: 34 %
MCH: 30.5 pg (ref 26.6–33.0)
MCHC: 32.7 g/dL (ref 31.5–35.7)
MCV: 93 fL (ref 79–97)
Monocytes Absolute: 0.5 x10E3/uL (ref 0.1–0.9)
Monocytes: 7 %
Neutrophils Absolute: 3.9 x10E3/uL (ref 1.4–7.0)
Neutrophils: 56 %
Platelets: 381 x10E3/uL (ref 150–450)
RBC: 4 x10E6/uL (ref 3.77–5.28)
RDW: 12.9 % (ref 11.7–15.4)
WBC: 7 x10E3/uL (ref 3.4–10.8)

## 2023-11-12 LAB — HEMOGLOBIN A1C
Est. average glucose Bld gHb Est-mCnc: 111 mg/dL
Hgb A1c MFr Bld: 5.5 % (ref 4.8–5.6)

## 2023-11-12 LAB — HIV ANTIBODY (ROUTINE TESTING W REFLEX): HIV Screen 4th Generation wRfx: NONREACTIVE

## 2023-11-12 LAB — TSH+T4F+T3FREE
Free T4: 1.28 ng/dL (ref 0.82–1.77)
T3, Free: 3 pg/mL (ref 2.0–4.4)
TSH: 1.09 u[IU]/mL (ref 0.450–4.500)

## 2023-11-12 LAB — CMP14+EGFR
ALT: 11 IU/L (ref 0–32)
AST: 21 IU/L (ref 0–40)
Albumin: 4.6 g/dL (ref 3.8–4.9)
Alkaline Phosphatase: 128 IU/L — ABNORMAL HIGH (ref 44–121)
BUN/Creatinine Ratio: 16 (ref 9–23)
BUN: 13 mg/dL (ref 6–24)
Bilirubin Total: <0.2 mg/dL (ref 0.0–1.2)
CO2: 23 mmol/L (ref 20–29)
Calcium: 9.5 mg/dL (ref 8.7–10.2)
Chloride: 102 mmol/L (ref 96–106)
Creatinine, Ser: 0.82 mg/dL (ref 0.57–1.00)
Globulin, Total: 2 g/dL (ref 1.5–4.5)
Glucose: 86 mg/dL (ref 70–99)
Potassium: 4.2 mmol/L (ref 3.5–5.2)
Sodium: 139 mmol/L (ref 134–144)
Total Protein: 6.6 g/dL (ref 6.0–8.5)
eGFR: 85 mL/min/1.73 (ref >59)

## 2023-11-12 LAB — HCV INTERPRETATION

## 2023-11-12 LAB — HCV AB W REFLEX TO QUANT PCR: HCV Ab: NONREACTIVE

## 2023-11-12 LAB — VITAMIN B12: Vitamin B-12: 223 pg/mL — ABNORMAL LOW (ref 232–1245)

## 2023-11-13 ENCOUNTER — Encounter: Payer: Self-pay | Admitting: Family

## 2023-11-18 IMAGING — MG MM DIGITAL SCREENING BILAT W/ TOMO AND CAD
8 series · 9 of 24 positions shown · non-contrast
Comparison: Previous exam(s).

CLINICAL DATA: Screening.

EXAM:
DIGITAL SCREENING BILATERAL MAMMOGRAM WITH TOMOSYNTHESIS AND CAD
TECHNIQUE: Bilateral screening digital craniocaudal and mediolateral oblique
mammograms were obtained. Bilateral screening digital breast
tomosynthesis was performed. The images were evaluated with
computer-aided detection.

[R MLO synth-2D]
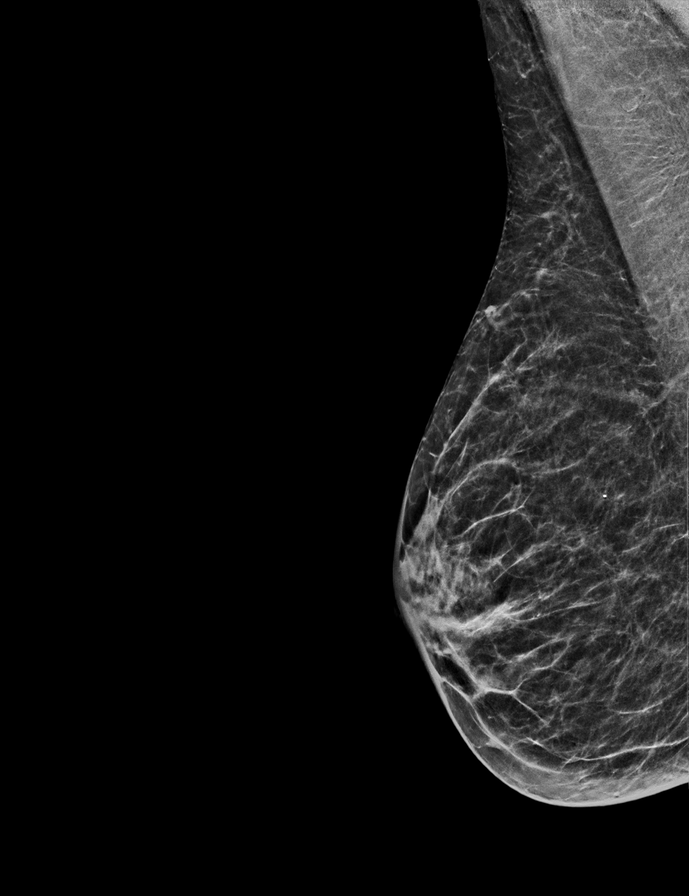

[L CC synth-2D]
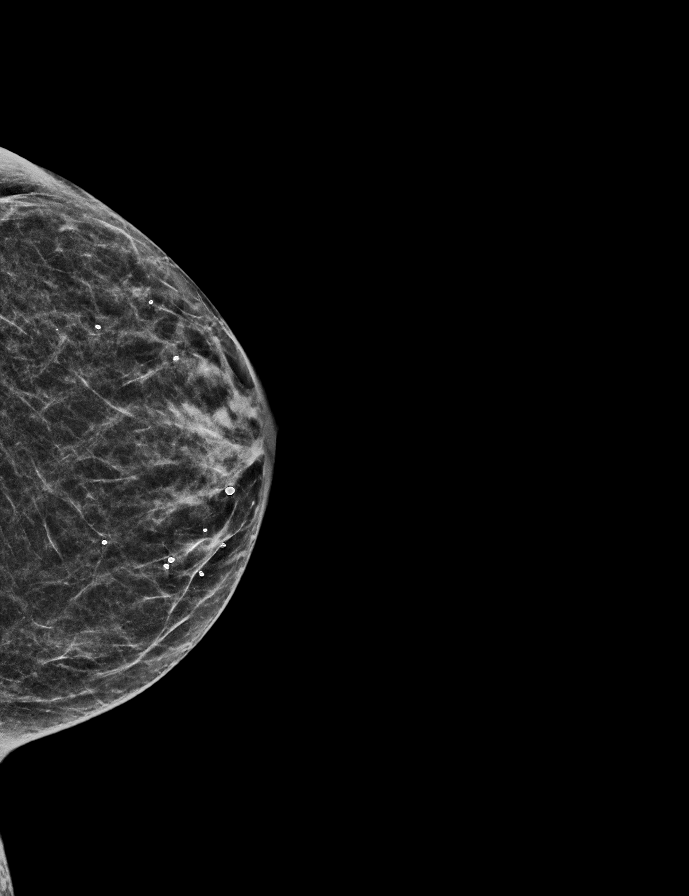

[R CC synth-2D]
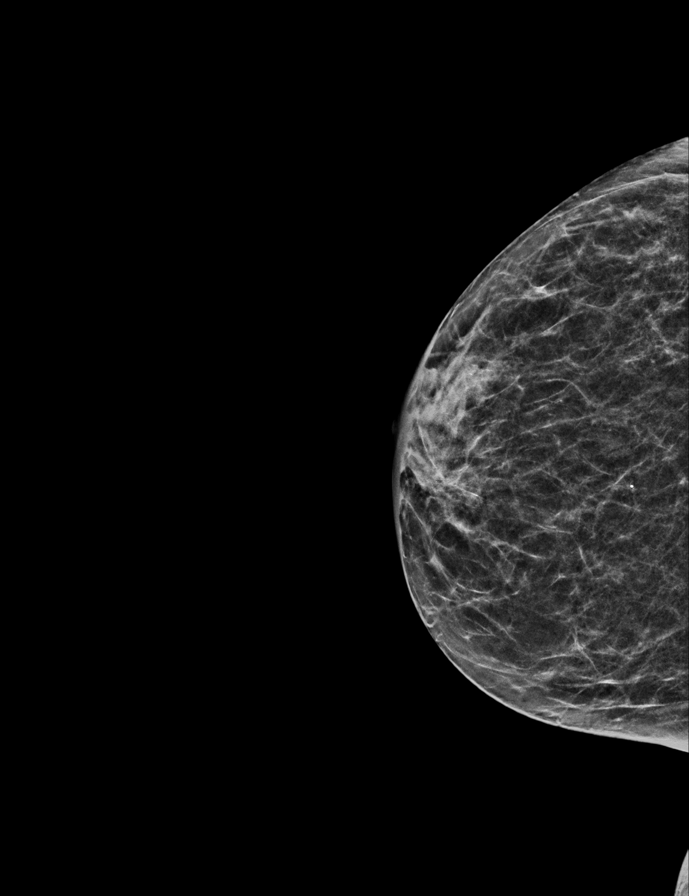

[L MLO synth-2D]
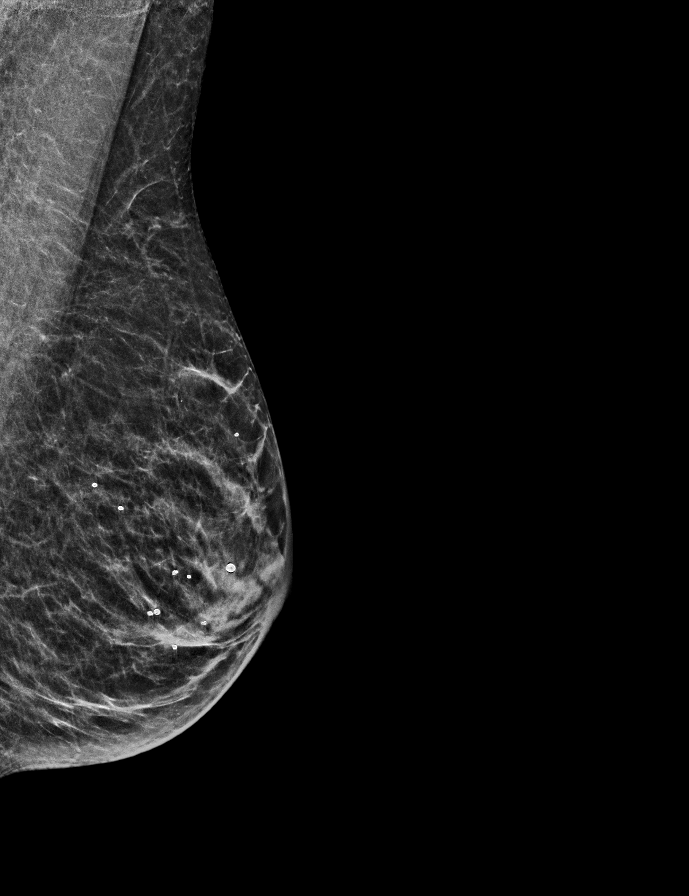

[R MLO tomo · 2 of 49 frames shown]
[frame 16/49]
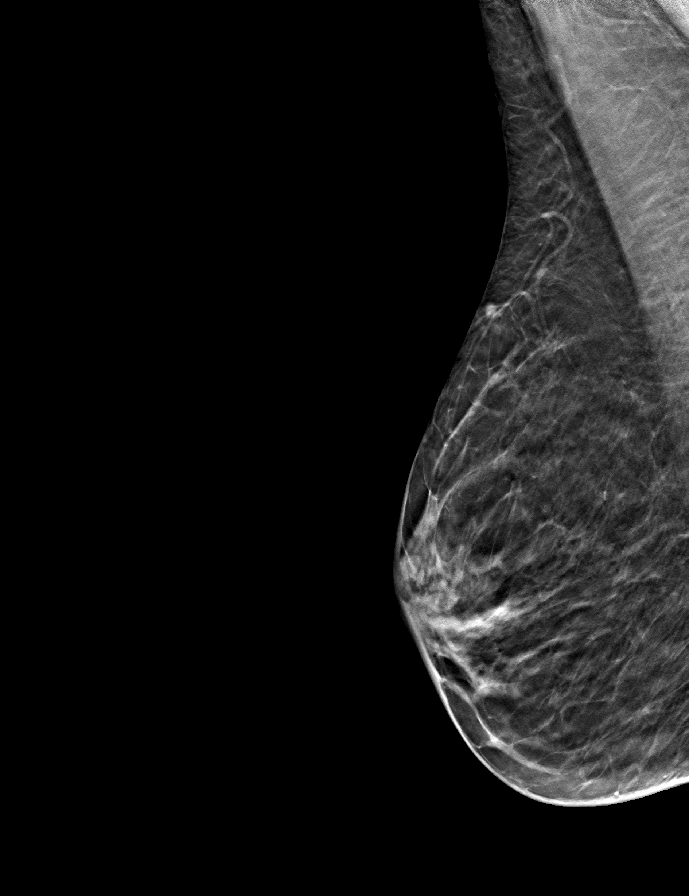
[frame 25/49]
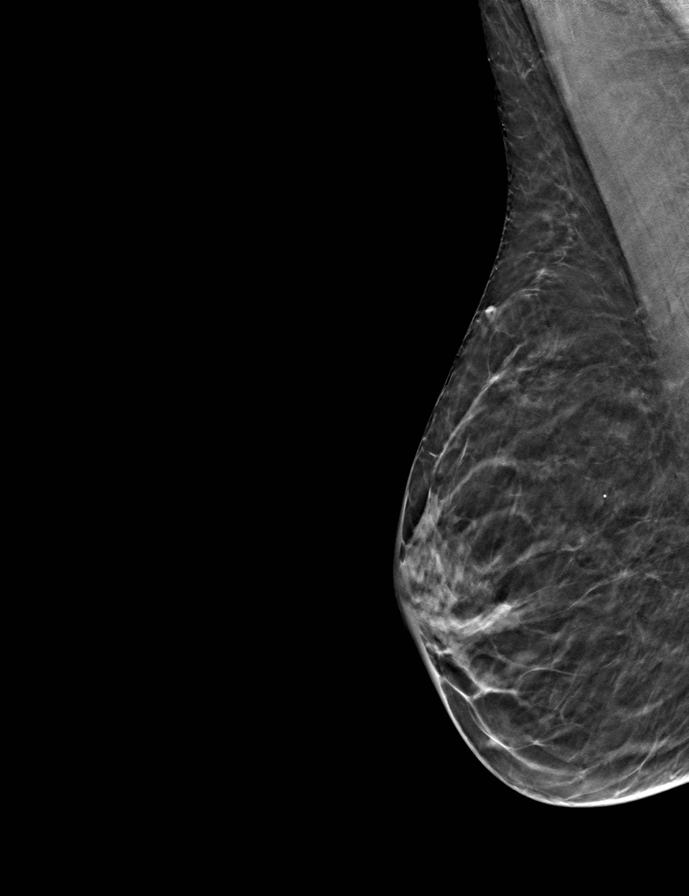

[L MLO tomo · tomo slice 25/49.0]
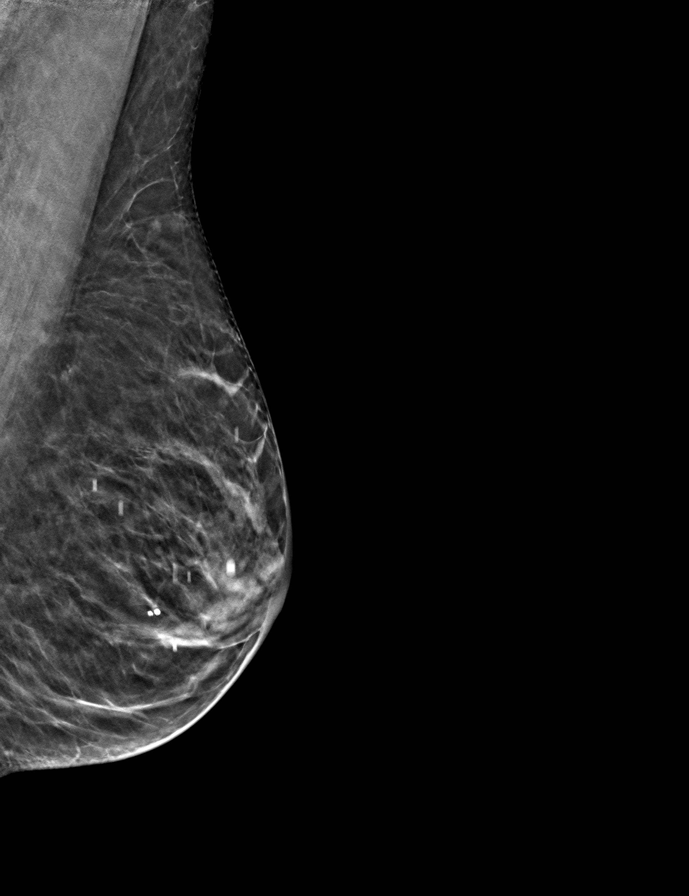

[R CC tomo · tomo slice 23/45.0]
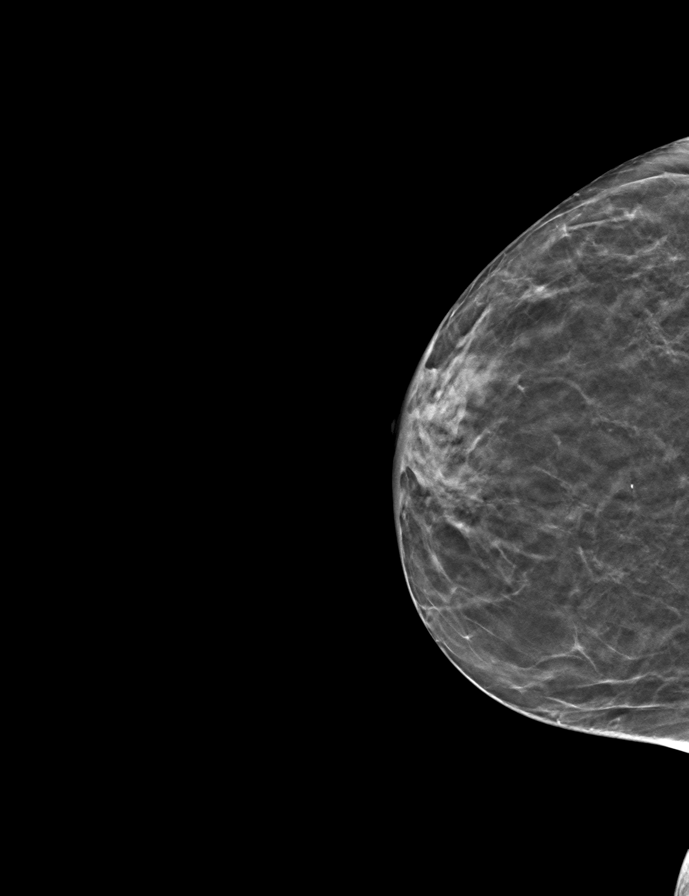

[L CC tomo · tomo slice 25/49.0]
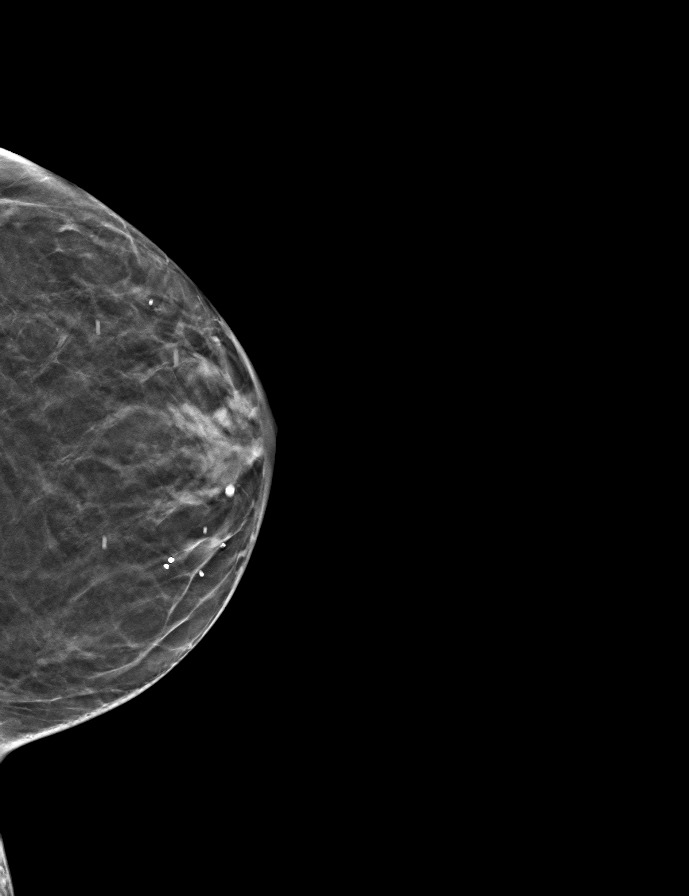

[9 of 24 positions shown; findings below may reference images not displayed]

ACR Breast Density Category b: There are scattered areas of
fibroglandular density.
FINDINGS: There are no findings suspicious for malignancy.
IMPRESSION: No mammographic evidence of malignancy. A result letter of this
screening mammogram will be mailed directly to the patient.

RECOMMENDATION:
Screening mammogram in one year. (Code:51-O-LD2)

BI-RADS CATEGORY  1: Negative.

## 2023-11-26 ENCOUNTER — Ambulatory Visit (INDEPENDENT_AMBULATORY_CARE_PROVIDER_SITE_OTHER): Admitting: Family

## 2023-11-26 DIAGNOSIS — E538 Deficiency of other specified B group vitamins: Secondary | ICD-10-CM

## 2023-11-26 MED ORDER — CYANOCOBALAMIN 1000 MCG/ML IJ SOLN
1000.0000 ug | Freq: Once | INTRAMUSCULAR | Status: AC
  Administered 2023-11-26: 1000 ug via INTRAMUSCULAR

## 2023-11-26 NOTE — Progress Notes (Signed)
   CHIEF COMPLAINT  B12 Shot     REASON FOR VISIT  B12 Injection     ASSESSMENT  B12 Deficiency, Unspecified     PLAN  Diagnoses and all orders for this visit:  B12 deficiency due to diet -     cyanocobalamin  (VITAMIN B12) injection 1,000 mcg     Pt. given B12 injection in clinic.  Return for next injection per provider instructions.   Total time spent: 5 minutes  ALAN CHRISTELLA ARRANT, FNP  11/26/2023

## 2023-12-04 ENCOUNTER — Other Ambulatory Visit: Payer: Self-pay | Admitting: Family

## 2023-12-07 ENCOUNTER — Other Ambulatory Visit: Payer: Self-pay

## 2023-12-08 NOTE — Telephone Encounter (Signed)
 Pt sent mychart message asking for this to be called in

## 2023-12-10 ENCOUNTER — Other Ambulatory Visit: Payer: Self-pay | Admitting: Family

## 2023-12-10 MED ORDER — BUPROPION HCL ER (XL) 300 MG PO TB24: 300.0000 mg | ORAL_TABLET | Freq: Every day | ORAL | 1 refills | Status: AC

## 2024-01-01 ENCOUNTER — Encounter: Payer: Self-pay | Admitting: Family

## 2024-01-01 ENCOUNTER — Ambulatory Visit (INDEPENDENT_AMBULATORY_CARE_PROVIDER_SITE_OTHER): Admitting: Family

## 2024-01-01 DIAGNOSIS — E538 Deficiency of other specified B group vitamins: Secondary | ICD-10-CM | POA: Diagnosis not present

## 2024-01-01 MED ORDER — CYANOCOBALAMIN 1000 MCG/ML IJ SOLN
1000.0000 ug | Freq: Once | INTRAMUSCULAR | Status: AC
  Administered 2024-01-01: 1000 ug via INTRAMUSCULAR

## 2024-01-01 NOTE — Progress Notes (Signed)
   CHIEF COMPLAINT  B12 Shot     REASON FOR VISIT  B12 Injection     ASSESSMENT  B12 Deficiency, Unspecified     PLAN  Diagnoses and all orders for this visit:  B12 deficiency due to diet -     cyanocobalamin  (VITAMIN B12) injection 1,000 mcg     Pt. given B12 injection in clinic.  Return for next injection per provider instructions.   Total time spent: 5 minutes  ALAN CHRISTELLA ARRANT, FNP  01/01/2024

## 2024-01-02 ENCOUNTER — Other Ambulatory Visit: Payer: Self-pay | Admitting: Cardiology

## 2024-01-02 DIAGNOSIS — M62838 Other muscle spasm: Secondary | ICD-10-CM

## 2024-01-27 ENCOUNTER — Ambulatory Visit: Admitting: Family

## 2024-01-27 DIAGNOSIS — E538 Deficiency of other specified B group vitamins: Secondary | ICD-10-CM | POA: Diagnosis not present

## 2024-01-27 MED ORDER — CYANOCOBALAMIN 1000 MCG/ML IJ SOLN
1000.0000 ug | Freq: Once | INTRAMUSCULAR | Status: AC
  Administered 2024-01-27: 1000 ug via INTRAMUSCULAR

## 2024-01-27 NOTE — Progress Notes (Signed)
   CHIEF COMPLAINT  B12 Shot     REASON FOR VISIT  B12 Injection     ASSESSMENT  B12 Deficiency, Unspecified     PLAN  Diagnoses and all orders for this visit:  B12 deficiency due to diet -     cyanocobalamin  (VITAMIN B12) injection 1,000 mcg     Pt. given B12 injection in clinic.  Return for next injection per provider instructions.   Total time spent: 5 minutes  ALAN CHRISTELLA ARRANT, FNP  01/27/2024

## 2024-02-01 ENCOUNTER — Other Ambulatory Visit: Payer: Self-pay | Admitting: Family

## 2024-02-04 ENCOUNTER — Encounter: Payer: Self-pay | Admitting: Family

## 2024-02-04 ENCOUNTER — Other Ambulatory Visit: Payer: Self-pay

## 2024-02-04 ENCOUNTER — Ambulatory Visit: Admitting: Nurse Practitioner

## 2024-02-17 ENCOUNTER — Ambulatory Visit: Admitting: Family

## 2024-03-04 ENCOUNTER — Other Ambulatory Visit: Payer: Self-pay | Admitting: Family

## 2024-03-04 DIAGNOSIS — M62838 Other muscle spasm: Secondary | ICD-10-CM

## 2024-03-07 ENCOUNTER — Encounter: Payer: Self-pay | Admitting: Family

## 2024-03-17 ENCOUNTER — Other Ambulatory Visit: Payer: Self-pay

## 2024-03-17 MED ORDER — METHOCARBAMOL 500 MG PO TABS: 500.0000 mg | ORAL_TABLET | Freq: Three times a day (TID) | ORAL | 1 refills | Status: AC

## 2024-03-21 ENCOUNTER — Other Ambulatory Visit: Payer: Self-pay

## 2024-03-21 MED ORDER — TOPIRAMATE 25 MG PO TABS: 25.0000 mg | ORAL_TABLET | Freq: Every day | ORAL | 2 refills | Status: AC

## 2024-07-08 ENCOUNTER — Ambulatory Visit: Admitting: Nurse Practitioner

## 2024-11-10 ENCOUNTER — Ambulatory Visit
